# Patient Record
Sex: Male | Born: 1950 | Race: White | Hispanic: No | Marital: Single | Smoking: Current some day smoker
Health system: Southern US, Community
[De-identification: ages and names within clinical notes are randomized; demographics above are authoritative.]

## PROBLEM LIST (undated history)

## (undated) DIAGNOSIS — E785 Hyperlipidemia, unspecified: Secondary | ICD-10-CM

## (undated) DIAGNOSIS — K219 Gastro-esophageal reflux disease without esophagitis: Secondary | ICD-10-CM

## (undated) DIAGNOSIS — M1990 Unspecified osteoarthritis, unspecified site: Secondary | ICD-10-CM

## (undated) DIAGNOSIS — I10 Essential (primary) hypertension: Secondary | ICD-10-CM

## (undated) DIAGNOSIS — C801 Malignant (primary) neoplasm, unspecified: Secondary | ICD-10-CM

## (undated) HISTORY — PX: PROSTATECTOMY: SHX69

## (undated) HISTORY — PX: ORCHIECTOMY: SHX2116

## (undated) HISTORY — PX: BACK SURGERY: SHX140

---

## 2007-04-18 ENCOUNTER — Ambulatory Visit: Payer: Self-pay | Admitting: Internal Medicine

## 2007-04-18 ENCOUNTER — Ambulatory Visit: Payer: Self-pay

## 2007-05-19 ENCOUNTER — Ambulatory Visit: Payer: Self-pay | Admitting: Internal Medicine

## 2007-06-25 ENCOUNTER — Other Ambulatory Visit: Payer: Self-pay

## 2007-06-25 ENCOUNTER — Ambulatory Visit: Payer: Self-pay | Admitting: Urology

## 2007-07-15 ENCOUNTER — Ambulatory Visit: Payer: Self-pay | Admitting: Urology

## 2010-03-22 ENCOUNTER — Ambulatory Visit (HOSPITAL_COMMUNITY): Admission: RE | Admit: 2010-03-22 | Discharge: 2010-03-22 | Payer: Self-pay | Admitting: Gastroenterology

## 2010-04-18 ENCOUNTER — Encounter (HOSPITAL_COMMUNITY): Admission: RE | Admit: 2010-04-18 | Discharge: 2010-05-18 | Payer: Self-pay | Admitting: Neurology

## 2010-10-30 ENCOUNTER — Other Ambulatory Visit (HOSPITAL_COMMUNITY): Payer: Self-pay

## 2010-11-16 ENCOUNTER — Ambulatory Visit (HOSPITAL_COMMUNITY)
Admission: RE | Admit: 2010-11-16 | Discharge: 2010-11-16 | Disposition: A | Discharge status: Home / Self Care | Payer: Self-pay | Source: Ambulatory Visit | Attending: Neurology | Admitting: Neurology

## 2010-11-16 DIAGNOSIS — R079 Chest pain, unspecified: Secondary | ICD-10-CM | POA: Insufficient documentation

## 2010-11-16 DIAGNOSIS — R002 Palpitations: Secondary | ICD-10-CM | POA: Insufficient documentation

## 2010-11-16 DIAGNOSIS — I270 Primary pulmonary hypertension: Secondary | ICD-10-CM

## 2010-11-16 DIAGNOSIS — I10 Essential (primary) hypertension: Secondary | ICD-10-CM | POA: Insufficient documentation

## 2010-11-16 DIAGNOSIS — I517 Cardiomegaly: Secondary | ICD-10-CM

## 2010-11-17 ENCOUNTER — Encounter (HOSPITAL_COMMUNITY): Payer: Self-pay

## 2010-11-17 ENCOUNTER — Other Ambulatory Visit (HOSPITAL_COMMUNITY): Payer: Self-pay

## 2011-12-20 ENCOUNTER — Telehealth: Payer: Self-pay

## 2011-12-20 NOTE — Telephone Encounter (Signed)
Per Crystal, Lubertha Basque said that pt has qualified for 100% [facility] assistance for colonoscopy. I called pt at [phone] and was told he does not live at that number  ( it was listed as home phone). So I removed it.   Than I called [phone] and LMOM for a return call.  ( I do not have a referral on him).

## 2011-12-25 NOTE — Telephone Encounter (Signed)
Called and [facility] for pt to return call [phone]). I will mail a letter to pt also to call.

## 2011-12-31 ENCOUNTER — Other Ambulatory Visit: Payer: Self-pay

## 2012-01-01 ENCOUNTER — Telehealth: Payer: Self-pay

## 2012-01-01 NOTE — Telephone Encounter (Signed)
T/C from the [facility] clinic. She received the fax that this pt has been scheduled for a colonoscopy. She said that pt is self referral. He has been wanting a colonoscopy but he was not referred by them. ( He had told me that he was referred by the [facility]) But I told her he  has some benefits from [facility] and Mrs. Ratliff said he has 100% coverage for a colonoscopy.

## 2012-01-01 NOTE — Telephone Encounter (Signed)
golytely prep UNLESS WE HAVE A MOVIPREP SAMPLE. IF MOVIPREP AVAILABLE, MOVI PREP SPLIT DOSING, REGULAR BREAKFAST. CLEAR LIQUIDS AFTER 9 AM.

## 2012-01-01 NOTE — Telephone Encounter (Signed)
Instructions mailed to pt along with a free voucher to take to the pharmacy for Movie Prep.

## 2012-01-01 NOTE — Telephone Encounter (Signed)
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  PROCEDURE: Procedure(s) requested: colonoscopy Procedure Reason: screening for colon cancer  PATIENT REVIEW QUESTIONS: The patient reports the following:   1. Diabetes Melitis: no 2. Joint replacements in the past 12 months: no 3. Major health problems in the past 3 months: no 4. Has an artificial valve or MVP:no 5. Has been advised in past to take antibiotics in advance of a procedure like teeth cleaning: no}    MEDICATIONS & ALLERGIES:    Patient reports the following regarding taking any blood thinners:   Plavix? no Aspirin?yes  Coumadin?  no  Patient confirms/reports the following medications:  Current Outpatient Prescriptions  Medication Sig Dispense Refill  . aspirin 81 MG tablet Take 81 mg by mouth daily.      Marland Kitchen lisinopril (PRINIVIL,ZESTRIL) 20 MG tablet Take 20 mg by mouth daily.      . pravastatin (PRAVACHOL) 20 MG tablet Take 20 mg by mouth daily.      . ranitidine (ZANTAC) 150 MG tablet Take 150 mg by mouth 2 (two) times daily.        Patient confirms/reports the following allergies:  No Known Allergies  Patient is appropriate to schedule for requested procedure(s): yes  AUTHORIZATION INFORMATION Primary Insurance:  ID #:   Group #:  Pre-Cert / Auth required: Pre-Cert / Auth #:   Secondary Insurance:   ID #:   Group #:  Pre-Cert / Auth required:  Pre-Cert / Auth #:   No orders of the defined types were placed in this encounter.    SCHEDULE INFORMATION: Procedure has been scheduled as follows:  Date: 01/21/2012           Time: 11:15 AM  Location: [facility] Short Stay   This Gastroenterology Pre-Precedure Form is being routed to the following provider(s) for review:  Jonette Eva, MD

## 2012-01-01 NOTE — Telephone Encounter (Signed)
Called [place] again. Pt does have 100% discount on services, other than elective.

## 2012-01-18 ENCOUNTER — Encounter (HOSPITAL_COMMUNITY): Payer: Self-pay | Admitting: Pharmacy Technician

## 2012-01-21 ENCOUNTER — Encounter (HOSPITAL_COMMUNITY): Payer: Self-pay | Admitting: *Deleted

## 2012-01-21 ENCOUNTER — Ambulatory Visit (HOSPITAL_COMMUNITY)
Admission: RE | Admit: 2012-01-21 | Discharge: 2012-01-21 | Disposition: A | Discharge status: Home / Self Care | Payer: Self-pay | Source: Ambulatory Visit | Attending: Gastroenterology | Admitting: Gastroenterology

## 2012-01-21 ENCOUNTER — Encounter (HOSPITAL_COMMUNITY)
Admission: RE | Disposition: A | Discharge status: Home / Self Care | Payer: Self-pay | Source: Ambulatory Visit | Attending: Gastroenterology

## 2012-01-21 DIAGNOSIS — K5730 Diverticulosis of large intestine without perforation or abscess without bleeding: Secondary | ICD-10-CM

## 2012-01-21 DIAGNOSIS — Z1211 Encounter for screening for malignant neoplasm of colon: Secondary | ICD-10-CM

## 2012-01-21 DIAGNOSIS — K648 Other hemorrhoids: Secondary | ICD-10-CM | POA: Insufficient documentation

## 2012-01-21 DIAGNOSIS — Z79899 Other long term (current) drug therapy: Secondary | ICD-10-CM | POA: Insufficient documentation

## 2012-01-21 DIAGNOSIS — Z8546 Personal history of malignant neoplasm of prostate: Secondary | ICD-10-CM | POA: Insufficient documentation

## 2012-01-21 DIAGNOSIS — I10 Essential (primary) hypertension: Secondary | ICD-10-CM | POA: Insufficient documentation

## 2012-01-21 DIAGNOSIS — Z7982 Long term (current) use of aspirin: Secondary | ICD-10-CM | POA: Insufficient documentation

## 2012-01-21 HISTORY — DX: Malignant (primary) neoplasm, unspecified: C80.1

## 2012-01-21 HISTORY — PX: COLONOSCOPY: SHX5424

## 2012-01-21 HISTORY — DX: Unspecified osteoarthritis, unspecified site: M19.90

## 2012-01-21 HISTORY — DX: Hyperlipidemia, unspecified: E78.5

## 2012-01-21 HISTORY — DX: Gastro-esophageal reflux disease without esophagitis: K21.9

## 2012-01-21 HISTORY — DX: Essential (primary) hypertension: I10

## 2012-01-21 SURGERY — COLONOSCOPY
Anesthesia: Moderate Sedation

## 2012-01-21 MED ORDER — MEPERIDINE HCL 100 MG/ML IJ SOLN
INTRAMUSCULAR | Status: AC
  Filled 2012-01-21: qty 2

## 2012-01-21 MED ORDER — MIDAZOLAM HCL 5 MG/5ML IJ SOLN
INTRAMUSCULAR | Status: DC | PRN
  Administered 2012-01-21 (×2): 2 mg via INTRAVENOUS
  Administered 2012-01-21: 1 mg via INTRAVENOUS

## 2012-01-21 MED ORDER — STERILE WATER FOR IRRIGATION IR SOLN
Status: DC | PRN
  Administered 2012-01-21: 12:00:00

## 2012-01-21 MED ORDER — MEPERIDINE HCL 100 MG/ML IJ SOLN
INTRAMUSCULAR | Status: DC | PRN
  Administered 2012-01-21: 50 mg via INTRAVENOUS
  Administered 2012-01-21 (×2): 25 mg via INTRAVENOUS

## 2012-01-21 MED ORDER — SODIUM CHLORIDE 0.45 % IV SOLN
INTRAVENOUS | Status: DC
  Administered 2012-01-21: 1000 mL via INTRAVENOUS

## 2012-01-21 MED ORDER — MIDAZOLAM HCL 5 MG/5ML IJ SOLN
INTRAMUSCULAR | Status: AC
  Filled 2012-01-21: qty 10

## 2012-01-21 NOTE — Op Note (Signed)
[facility] [address] [place], [place]  [address]  COLONOSCOPY PROCEDURE REPORT  PATIENT:  Timothy, Nguyen  MR#:  [id number] BIRTHDATE:  1951-08-05, 60 yrs. old  GENDER:  male  ENDOSCOPIST:  Jonette Eva, MD REF. BY:  [facility] ASSISTANT:  PROCEDURE DATE:  01/21/2012 PROCEDURE:  Colonoscopy 62[address]  INDICATIONS:  SCREENING  MEDICATIONS:   Demerol 100 mg IV, Versed 5 mg IV  DESCRIPTION OF PROCEDURE:    Physical exam was performed. Informed consent was obtained from the patient after explaining the benefits, risks, and alternatives to procedure.  The patient was connected to monitor and placed in left lateral position. Continuous oxygen was provided by nasal cannula and IV medicine administered through an indwelling cannula.  After administration of sedation and rectal exam, the patient's rectum was intubated and the EC-[id number] ([id number]) colonoscope was advanced under direct visualization to the cecum.  The scope was removed slowly by carefully examining the color, texture, anatomy, and integrity mucosa on the way out.  The patient was recovered in endoscopy and discharged home in satisfactory condition. <<PROCEDUREIMAGES>>  FINDINGS:  FREQUENT Diverticula were found in the sigmoid colon. MODERATE Internal Hemorrhoids were found.  PREP QUALITY: EXCELLENT CECAL W/D TIME:    10 minutes  COMPLICATIONS:    None  ENDOSCOPIC IMPRESSION: 1) MODERATE DiverticulOSIS in the sigmoid colon 2) Internal hemorrhoids  RECOMMENDATIONS: HIGH FIBER DIET TCS IN 10 YEARS ALEVE BID FOR 7 DAYS FOR FOOT PAIN  REPEAT EXAM:  No  ______________________________ Jonette Eva, MD  CC:  n. eSIGNED:   [name] at 01/21/2012 12:27 PM  [place], [phone]

## 2012-01-21 NOTE — Discharge Instructions (Signed)
You have small internal hemorrhoids and diverticulosis.   FOLLOW A HIGH FIBER DIET. AVOID ITEMS THAT CAUSE BLOATING. SEE INFO BELOW.  USE ALEVE 2 TWICE DAILY FOR 7 DAYS YOUR LEFT FOOT PAIN. TAKE ALEVE WITH FOOD OR MILK OR IT MAY CAUSE GASTRITIS OR ULCERS.  Next colonoscopy in 10 years. Colonoscopy Care After Read the instructions outlined below and refer to this sheet in the next week. These discharge instructions provide you with general information on caring for yourself after you leave the hospital. While your treatment has been planned according to the most current medical practices available, unavoidable complications occasionally occur. If you have any problems or questions after discharge, call DR. Eldora Napp, [phone].  ACTIVITY  You may resume your regular activity, but move at a slower pace for the next 24 hours.   Take frequent rest periods for the next 24 hours.   Walking will help get rid of the air and reduce the bloated feeling in your belly (abdomen).   No driving for 24 hours (because of the medicine (anesthesia) used during the test).   You may shower.   Do not sign any important legal documents or operate any machinery for 24 hours (because of the anesthesia used during the test).    NUTRITION  Drink plenty of fluids.   You may resume your normal diet as instructed by your doctor.   Begin with a light meal and progress to your normal diet. Heavy or fried foods are harder to digest and may make you feel sick to your stomach (nauseated).   Avoid alcoholic beverages for 24 hours or as instructed.    MEDICATIONS  You may resume your normal medications.   WHAT YOU CAN EXPECT TODAY  Some feelings of bloating in the abdomen.   Passage of more gas than usual.   Spotting of blood in your stool or on the toilet paper  .  IF YOU HAD POLYPS REMOVED DURING THE COLONOSCOPY:  Eat a soft diet IF YOU HAVE NAUSEA, BLOATING, ABDOMINAL PAIN, OR VOMITING.      FINDING OUT THE RESULTS OF YOUR TEST Not all test results are available during your visit. DR. Darrick Penna WILL CALL YOU WITHIN 7 DAYS OF YOUR PROCEDUE WITH YOUR RESULTS. Do not assume everything is normal if you have not heard from DR. Williette Loewe IN ONE WEEK, CALL HER OFFICE AT [phone].  SEEK IMMEDIATE MEDICAL ATTENTION AND CALL THE OFFICE: [phone] IF:  You have more than a spotting of blood in your stool.   Your belly is swollen (abdominal distention).   You are nauseated or vomiting.   You have a temperature over 101F.   You have abdominal pain or discomfort that is severe or gets worse throughout the day.  High-Fiber Diet A high-fiber diet changes your normal diet to include more whole grains, legumes, fruits, and vegetables. Changes in the diet involve replacing refined carbohydrates with unrefined foods. The calorie level of the diet is essentially unchanged. The Dietary Reference Intake (recommended amount) for adult males is 38 grams per day. For adult females, it is 25 grams per day. Pregnant and lactating women should consume 28 grams of fiber per day. Fiber is the intact part of a plant that is not broken down during digestion. Functional fiber is fiber that has been isolated from the plant to provide a beneficial effect in the body. PURPOSE  Increase stool bulk.   Ease and regulate bowel movements.   Lower cholesterol.  INDICATIONS THAT YOU NEED MORE  FIBER  Constipation and hemorrhoids.   Uncomplicated diverticulosis (intestine condition) and irritable bowel syndrome.   Weight management.   As a protective measure against hardening of the arteries (atherosclerosis), diabetes, and cancer.   GUIDELINES FOR INCREASING FIBER IN THE DIET  Start adding fiber to the diet slowly. A gradual increase of about 5 more grams (2 slices of whole-wheat bread, 2 servings of most fruits or vegetables, or 1 bowl of high-fiber cereal) per day is best. Too rapid an increase in fiber  may result in constipation, flatulence, and bloating.   Drink enough water and fluids to keep your urine clear or pale yellow. Water, juice, or caffeine-free drinks are recommended. Not drinking enough fluid may cause constipation.   Eat a variety of high-fiber foods rather than one type of fiber.   Try to increase your intake of fiber through using high-fiber foods rather than fiber pills or supplements that contain small amounts of fiber.   The goal is to change the types of food eaten. Do not supplement your present diet with high-fiber foods, but replace foods in your present diet.  INCLUDE A VARIETY OF FIBER SOURCES  Replace refined and processed grains with whole grains, canned fruits with fresh fruits, and incorporate other fiber sources. White rice, white breads, and most bakery goods contain little or no fiber.   Brown whole-grain rice, buckwheat oats, and many fruits and vegetables are all good sources of fiber. These include: broccoli, Brussels sprouts, cabbage, cauliflower, beets, sweet potatoes, white potatoes (skin on), carrots, tomatoes, eggplant, squash, berries, fresh fruits, and dried fruits.   Cereals appear to be the richest source of fiber. Cereal fiber is found in whole grains and bran. Bran is the fiber-rich outer coat of cereal grain, which is largely removed in refining. In whole-grain cereals, the bran remains. In breakfast cereals, the largest amount of fiber is found in those with "bran" in their names. The fiber content is sometimes indicated on the label.   You may need to include additional fruits and vegetables each day.   In baking, for 1 cup white flour, you may use the following substitutions:   1 cup whole-wheat flour minus 2 tablespoons.   1/2 cup white flour plus 1/2 cup whole-wheat flour.   Diverticulosis Diverticulosis is a common condition that develops when small pouches (diverticula) form in the wall of the colon. The risk of diverticulosis  increases with age. It happens more often in people who eat a low-fiber diet. Most individuals with diverticulosis have no symptoms. Those individuals with symptoms usually experience belly (abdominal) pain, constipation, or loose stools (diarrhea).  HOME CARE INSTRUCTIONS  Increase the amount of fiber in your diet as directed by your caregiver or dietician. This may reduce symptoms of diverticulosis.   Drink at least 6 to 8 glasses of water each day to prevent constipation.   Try not to strain when you have a bowel movement.   Avoiding nuts and seeds to prevent complications is still an uncertain benefit.       FOODS HAVING HIGH FIBER CONTENT INCLUDE:  Fruits. Apple, peach, pear, tangerine, raisins, prunes.   Vegetables. Brussels sprouts, asparagus, broccoli, cabbage, carrot, cauliflower, romaine lettuce, spinach, summer squash, tomato, winter squash, zucchini.   Starchy Vegetables. Baked beans, kidney beans, lima beans, split peas, lentils, potatoes (with skin).   Grains. Whole wheat bread, brown rice, bran flake cereal, plain oatmeal, white rice, shredded wheat, bran muffins.    SEEK IMMEDIATE MEDICAL CARE IF:  You  develop increasing pain or severe bloating.   You have an oral temperature above 101F.   You develop vomiting or bowel movements that are bloody or black.   Hemorrhoids Hemorrhoids are dilated (enlarged) veins around the rectum. Sometimes clots will form in the veins. This makes them swollen and painful. These are called thrombosed hemorrhoids. Causes of hemorrhoids include:  Constipation.   Straining to have a bowel movement.   HEAVY LIFTING HOME CARE INSTRUCTIONS  Eat a well balanced diet and drink 6 to 8 glasses of water every day to avoid constipation. You may also use a bulk laxative.   Avoid straining to have bowel movements.   Keep anal area dry and clean.   Do not use a donut shaped pillow or sit on the toilet for long periods. This increases  blood pooling and pain.   Move your bowels when your body has the urge; this will require less straining and will decrease pain and pressure.

## 2012-01-21 NOTE — H&P (Signed)
  Primary Care Physician:   Primary Gastroenterologist:  Dr. Darrick Penna  Pre-Procedure History & Physical: HPI:  Timothy Nguyen is a 61 y.o. male here for COLON CANCER SCREENING.   Past Medical History  Diagnosis Date  . GERD (gastroesophageal reflux disease)   . Hypertension   . Hyperlipemia   . Cancer     prostate, testicular  . Arthritis     Past Surgical History  Procedure Date  . Back surgery   . Orchiectomy   . Prostatectomy     Prior to Admission medications   Medication Sig Start Date End Date Taking? Authorizing Provider  aspirin EC 81 MG tablet Take 81 mg by mouth daily.   Yes Historical Provider, MD  lisinopril (PRINIVIL,ZESTRIL) 20 MG tablet Take 20 mg by mouth daily.   Yes Historical Provider, MD  pravastatin (PRAVACHOL) 20 MG tablet Take 20 mg by mouth daily.   Yes Historical Provider, MD  ranitidine (ZANTAC) 150 MG tablet Take 150 mg by mouth 2 (two) times daily.   Yes Historical Provider, MD    Allergies as of 12/31/2011  . (No Known Allergies)    History reviewed. No pertinent family history.  History   Social History  . Marital Status: Single    Spouse Name: N/A    Number of Children: N/A  . Years of Education: N/A   Occupational History  . Not on file.   Social History Main Topics  . Smoking status: Never Smoker   . Smokeless tobacco: Not on file  . Alcohol Use: Yes  . Drug Use: No  . Sexually Active:    Other Topics Concern  . Not on file   Social History Narrative  . No narrative on file    Review of Systems: See HPI, otherwise negative ROS   Physical Exam: BP 141/96  Pulse 82  Temp(Src) 97.8 F (36.6 C) (Oral)  Resp 18  Ht 5\' 7"  (1.702 m)  Wt 160 lb (72.576 kg)  BMI 25.06 kg/m2  SpO2 99% General:   Alert,  pleasant and cooperative in NAD Head:  Normocephalic and atraumatic. Neck:  Supple;  Lungs:  Clear throughout to auscultation.    Heart:  Regular rate and rhythm. Abdomen:  Soft, nontender and nondistended. Normal  bowel sounds, without guarding, and without rebound.   Neurologic:  Alert and  oriented x4;  grossly normal neurologically.  Impression/Plan:    AVERAGE RISK  PLAN: TCS TODAY

## 2012-01-23 ENCOUNTER — Encounter (HOSPITAL_COMMUNITY): Payer: Self-pay | Admitting: Gastroenterology

## 2012-01-28 ENCOUNTER — Other Ambulatory Visit: Payer: Self-pay | Admitting: Obstetrics and Gynecology

## 2012-03-03 ENCOUNTER — Other Ambulatory Visit: Payer: Self-pay | Admitting: Obstetrics and Gynecology

## 2015-08-02 ENCOUNTER — Other Ambulatory Visit: Payer: Self-pay | Admitting: Physician Assistant

## 2015-08-27 ENCOUNTER — Other Ambulatory Visit: Payer: Self-pay | Admitting: Physician Assistant

## 2015-10-14 DIAGNOSIS — F411 Generalized anxiety disorder: Secondary | ICD-10-CM | POA: Diagnosis not present

## 2015-10-14 DIAGNOSIS — F1010 Alcohol abuse, uncomplicated: Secondary | ICD-10-CM | POA: Diagnosis not present

## 2015-10-14 DIAGNOSIS — Z8546 Personal history of malignant neoplasm of prostate: Secondary | ICD-10-CM | POA: Diagnosis not present

## 2015-10-14 DIAGNOSIS — R000 Tachycardia, unspecified: Secondary | ICD-10-CM | POA: Diagnosis not present

## 2015-10-14 DIAGNOSIS — F17210 Nicotine dependence, cigarettes, uncomplicated: Secondary | ICD-10-CM | POA: Diagnosis not present

## 2015-10-14 DIAGNOSIS — K219 Gastro-esophageal reflux disease without esophagitis: Secondary | ICD-10-CM | POA: Diagnosis not present

## 2015-10-14 DIAGNOSIS — Z205 Contact with and (suspected) exposure to viral hepatitis: Secondary | ICD-10-CM | POA: Diagnosis not present

## 2015-10-14 DIAGNOSIS — E782 Mixed hyperlipidemia: Secondary | ICD-10-CM | POA: Diagnosis not present

## 2015-10-14 DIAGNOSIS — Z8547 Personal history of malignant neoplasm of testis: Secondary | ICD-10-CM | POA: Diagnosis not present

## 2015-10-14 DIAGNOSIS — I10 Essential (primary) hypertension: Secondary | ICD-10-CM | POA: Diagnosis not present

## 2015-10-17 DIAGNOSIS — I358 Other nonrheumatic aortic valve disorders: Secondary | ICD-10-CM | POA: Diagnosis not present

## 2015-10-17 DIAGNOSIS — R931 Abnormal findings on diagnostic imaging of heart and coronary circulation: Secondary | ICD-10-CM | POA: Diagnosis not present

## 2015-10-17 DIAGNOSIS — R000 Tachycardia, unspecified: Secondary | ICD-10-CM | POA: Diagnosis not present

## 2015-10-17 DIAGNOSIS — E785 Hyperlipidemia, unspecified: Secondary | ICD-10-CM | POA: Diagnosis not present

## 2015-10-17 DIAGNOSIS — I10 Essential (primary) hypertension: Secondary | ICD-10-CM | POA: Diagnosis not present

## 2015-10-17 DIAGNOSIS — R9439 Abnormal result of other cardiovascular function study: Secondary | ICD-10-CM | POA: Diagnosis not present

## 2015-10-17 DIAGNOSIS — I517 Cardiomegaly: Secondary | ICD-10-CM | POA: Diagnosis not present

## 2015-10-17 DIAGNOSIS — I351 Nonrheumatic aortic (valve) insufficiency: Secondary | ICD-10-CM | POA: Diagnosis not present

## 2015-10-17 DIAGNOSIS — I340 Nonrheumatic mitral (valve) insufficiency: Secondary | ICD-10-CM | POA: Diagnosis not present

## 2015-10-25 DIAGNOSIS — R9439 Abnormal result of other cardiovascular function study: Secondary | ICD-10-CM | POA: Diagnosis not present

## 2015-12-26 DIAGNOSIS — M7522 Bicipital tendinitis, left shoulder: Secondary | ICD-10-CM | POA: Diagnosis not present

## 2015-12-26 DIAGNOSIS — M62522 Muscle wasting and atrophy, not elsewhere classified, left upper arm: Secondary | ICD-10-CM | POA: Diagnosis not present

## 2015-12-29 DIAGNOSIS — M25512 Pain in left shoulder: Secondary | ICD-10-CM | POA: Diagnosis not present

## 2015-12-29 DIAGNOSIS — M4802 Spinal stenosis, cervical region: Secondary | ICD-10-CM | POA: Diagnosis not present

## 2015-12-29 DIAGNOSIS — M47812 Spondylosis without myelopathy or radiculopathy, cervical region: Secondary | ICD-10-CM | POA: Diagnosis not present

## 2016-02-02 DIAGNOSIS — K219 Gastro-esophageal reflux disease without esophagitis: Secondary | ICD-10-CM | POA: Diagnosis not present

## 2016-02-02 DIAGNOSIS — I10 Essential (primary) hypertension: Secondary | ICD-10-CM | POA: Diagnosis not present

## 2016-02-02 DIAGNOSIS — F17210 Nicotine dependence, cigarettes, uncomplicated: Secondary | ICD-10-CM | POA: Diagnosis not present

## 2016-02-02 DIAGNOSIS — F1010 Alcohol abuse, uncomplicated: Secondary | ICD-10-CM | POA: Diagnosis not present

## 2016-02-02 DIAGNOSIS — F411 Generalized anxiety disorder: Secondary | ICD-10-CM | POA: Diagnosis not present

## 2016-02-02 DIAGNOSIS — E782 Mixed hyperlipidemia: Secondary | ICD-10-CM | POA: Diagnosis not present

## 2016-02-06 DIAGNOSIS — E782 Mixed hyperlipidemia: Secondary | ICD-10-CM | POA: Diagnosis not present

## 2016-02-06 DIAGNOSIS — F17210 Nicotine dependence, cigarettes, uncomplicated: Secondary | ICD-10-CM | POA: Diagnosis not present

## 2016-02-06 DIAGNOSIS — M7522 Bicipital tendinitis, left shoulder: Secondary | ICD-10-CM | POA: Diagnosis not present

## 2016-02-06 DIAGNOSIS — K219 Gastro-esophageal reflux disease without esophagitis: Secondary | ICD-10-CM | POA: Diagnosis not present

## 2016-02-06 DIAGNOSIS — M4802 Spinal stenosis, cervical region: Secondary | ICD-10-CM | POA: Diagnosis not present

## 2016-02-06 DIAGNOSIS — I10 Essential (primary) hypertension: Secondary | ICD-10-CM | POA: Diagnosis not present

## 2016-02-06 DIAGNOSIS — F411 Generalized anxiety disorder: Secondary | ICD-10-CM | POA: Diagnosis not present

## 2016-02-06 DIAGNOSIS — M62522 Muscle wasting and atrophy, not elsewhere classified, left upper arm: Secondary | ICD-10-CM | POA: Diagnosis not present

## 2016-07-30 DIAGNOSIS — F411 Generalized anxiety disorder: Secondary | ICD-10-CM | POA: Diagnosis not present

## 2016-07-30 DIAGNOSIS — F1010 Alcohol abuse, uncomplicated: Secondary | ICD-10-CM | POA: Diagnosis not present

## 2016-07-30 DIAGNOSIS — R000 Tachycardia, unspecified: Secondary | ICD-10-CM | POA: Diagnosis not present

## 2016-07-30 DIAGNOSIS — K219 Gastro-esophageal reflux disease without esophagitis: Secondary | ICD-10-CM | POA: Diagnosis not present

## 2016-07-30 DIAGNOSIS — F17210 Nicotine dependence, cigarettes, uncomplicated: Secondary | ICD-10-CM | POA: Diagnosis not present

## 2016-07-30 DIAGNOSIS — N5234 Erectile dysfunction following simple prostatectomy: Secondary | ICD-10-CM | POA: Diagnosis not present

## 2016-07-30 DIAGNOSIS — E782 Mixed hyperlipidemia: Secondary | ICD-10-CM | POA: Diagnosis not present

## 2016-07-30 DIAGNOSIS — I10 Essential (primary) hypertension: Secondary | ICD-10-CM | POA: Diagnosis not present

## 2016-08-07 DIAGNOSIS — F1010 Alcohol abuse, uncomplicated: Secondary | ICD-10-CM | POA: Diagnosis not present

## 2016-08-07 DIAGNOSIS — Z0001 Encounter for general adult medical examination with abnormal findings: Secondary | ICD-10-CM | POA: Diagnosis not present

## 2016-08-07 DIAGNOSIS — Z23 Encounter for immunization: Secondary | ICD-10-CM | POA: Diagnosis not present

## 2016-08-07 DIAGNOSIS — F411 Generalized anxiety disorder: Secondary | ICD-10-CM | POA: Diagnosis not present

## 2016-08-07 DIAGNOSIS — Z6827 Body mass index (BMI) 27.0-27.9, adult: Secondary | ICD-10-CM | POA: Diagnosis not present

## 2016-08-07 DIAGNOSIS — I10 Essential (primary) hypertension: Secondary | ICD-10-CM | POA: Diagnosis not present

## 2016-08-07 DIAGNOSIS — E782 Mixed hyperlipidemia: Secondary | ICD-10-CM | POA: Diagnosis not present

## 2016-08-07 DIAGNOSIS — F17210 Nicotine dependence, cigarettes, uncomplicated: Secondary | ICD-10-CM | POA: Diagnosis not present

## 2016-11-27 DIAGNOSIS — K219 Gastro-esophageal reflux disease without esophagitis: Secondary | ICD-10-CM | POA: Diagnosis not present

## 2016-11-27 DIAGNOSIS — Z8546 Personal history of malignant neoplasm of prostate: Secondary | ICD-10-CM | POA: Diagnosis not present

## 2016-11-27 DIAGNOSIS — M4802 Spinal stenosis, cervical region: Secondary | ICD-10-CM | POA: Diagnosis not present

## 2016-11-27 DIAGNOSIS — F411 Generalized anxiety disorder: Secondary | ICD-10-CM | POA: Diagnosis not present

## 2016-11-27 DIAGNOSIS — F1010 Alcohol abuse, uncomplicated: Secondary | ICD-10-CM | POA: Diagnosis not present

## 2016-11-27 DIAGNOSIS — I10 Essential (primary) hypertension: Secondary | ICD-10-CM | POA: Diagnosis not present

## 2016-11-27 DIAGNOSIS — E782 Mixed hyperlipidemia: Secondary | ICD-10-CM | POA: Diagnosis not present

## 2016-11-27 DIAGNOSIS — Z8547 Personal history of malignant neoplasm of testis: Secondary | ICD-10-CM | POA: Diagnosis not present

## 2016-12-03 DIAGNOSIS — J301 Allergic rhinitis due to pollen: Secondary | ICD-10-CM | POA: Diagnosis not present

## 2016-12-03 DIAGNOSIS — H6983 Other specified disorders of Eustachian tube, bilateral: Secondary | ICD-10-CM | POA: Diagnosis not present

## 2016-12-03 DIAGNOSIS — Z6827 Body mass index (BMI) 27.0-27.9, adult: Secondary | ICD-10-CM | POA: Diagnosis not present

## 2017-02-26 DIAGNOSIS — F17210 Nicotine dependence, cigarettes, uncomplicated: Secondary | ICD-10-CM | POA: Diagnosis not present

## 2017-02-26 DIAGNOSIS — N400 Enlarged prostate without lower urinary tract symptoms: Secondary | ICD-10-CM | POA: Diagnosis not present

## 2017-02-26 DIAGNOSIS — K219 Gastro-esophageal reflux disease without esophagitis: Secondary | ICD-10-CM | POA: Diagnosis not present

## 2017-02-26 DIAGNOSIS — E782 Mixed hyperlipidemia: Secondary | ICD-10-CM | POA: Diagnosis not present

## 2017-02-26 DIAGNOSIS — I10 Essential (primary) hypertension: Secondary | ICD-10-CM | POA: Diagnosis not present

## 2017-03-01 DIAGNOSIS — F17210 Nicotine dependence, cigarettes, uncomplicated: Secondary | ICD-10-CM | POA: Diagnosis not present

## 2017-03-01 DIAGNOSIS — I10 Essential (primary) hypertension: Secondary | ICD-10-CM | POA: Diagnosis not present

## 2017-03-01 DIAGNOSIS — F411 Generalized anxiety disorder: Secondary | ICD-10-CM | POA: Diagnosis not present

## 2017-03-01 DIAGNOSIS — F1010 Alcohol abuse, uncomplicated: Secondary | ICD-10-CM | POA: Diagnosis not present

## 2017-03-01 DIAGNOSIS — E782 Mixed hyperlipidemia: Secondary | ICD-10-CM | POA: Diagnosis not present

## 2017-03-01 DIAGNOSIS — K219 Gastro-esophageal reflux disease without esophagitis: Secondary | ICD-10-CM | POA: Diagnosis not present

## 2017-03-01 DIAGNOSIS — Z6826 Body mass index (BMI) 26.0-26.9, adult: Secondary | ICD-10-CM | POA: Diagnosis not present

## 2017-03-01 DIAGNOSIS — N400 Enlarged prostate without lower urinary tract symptoms: Secondary | ICD-10-CM | POA: Diagnosis not present

## 2017-03-21 ENCOUNTER — Encounter: Payer: Self-pay | Admitting: Cardiovascular Disease

## 2017-03-21 DIAGNOSIS — R9439 Abnormal result of other cardiovascular function study: Secondary | ICD-10-CM | POA: Diagnosis not present

## 2017-04-18 ENCOUNTER — Encounter: Payer: Self-pay | Admitting: Cardiovascular Disease

## 2017-04-18 ENCOUNTER — Encounter: Payer: Self-pay | Admitting: *Deleted

## 2017-04-18 ENCOUNTER — Ambulatory Visit (INDEPENDENT_AMBULATORY_CARE_PROVIDER_SITE_OTHER): Payer: Medicare HMO | Admitting: Cardiovascular Disease

## 2017-04-18 VITALS — BP 138/90 | HR 87 | Ht 65.0 in | Wt 165.0 lb

## 2017-04-18 DIAGNOSIS — R9439 Abnormal result of other cardiovascular function study: Secondary | ICD-10-CM | POA: Diagnosis not present

## 2017-04-18 DIAGNOSIS — I10 Essential (primary) hypertension: Secondary | ICD-10-CM | POA: Diagnosis not present

## 2017-04-18 NOTE — Patient Instructions (Signed)
Medication Instructions:  Continue all current medications.  Labwork: none  Testing/Procedures: none  Follow-Up: To be determined.    Any Other Special Instructions Will Be Listed Below (If Applicable).  If you need a refill on your cardiac medications before your next appointment, please call your pharmacy.  

## 2017-04-18 NOTE — Progress Notes (Addendum)
CARDIOLOGY CONSULT NOTE  Patient ID: Timothy Nguyen MRN: [id number] DOB/AGE: 01-16-1951 66 y.o.  Admit date: (Not on file) Primary Physician: Timothy Labrum, MD Referring Physician: Pleas Nguyen  Reason for Consultation: Abnormal stress test  HPI: Timothy Nguyen is a 66 y.o. male who is being seen today for the evaluation of abnormal stress test at the request of Timothy Nguyen, Timothy Evener, MD.  He has hypertension and hyperlipidemia.  He underwent a nuclear stress test on 03/21/17 at [facility]. I am trying to obtain the official report.  It appears he previously (2017) underwent a nuclear stress test which was abnormal and deemed to be false positive. At that time he had very good functional status. He apparently had an echocardiogram done at that time as well but I do not have these results.  He is very active and exercises at the [facility] 5-7 days per week. He lifts weights, runs, walks, and boxes. He denies exertional chest pain and shortness of breath.  He was scheduled to fight Timothy Nguyen in [place] back in the 1970s but his wife prevented him from doing so. He boxed in the light middleweight division.  He says he drinks too much hard liquor every now and then and knows he should quit.  ECG performed in the office today which I ordered in person interpreted demonstrated sinus rhythm with nonspecific ST segment and T-wave abnormalities.   No Known Allergies  Current Outpatient Prescriptions  Medication Sig Dispense Refill  . aspirin EC 81 MG tablet Take 81 mg by mouth daily.    . fluticasone (FLONASE ALLERGY RELIEF) 50 MCG/ACT nasal spray Place into both nostrils daily.    Marland Kitchen lisinopril (PRINIVIL,ZESTRIL) 20 MG tablet Take 20 mg by mouth 2 (two) times daily.     Marland Kitchen loratadine (CLARITIN) 10 MG tablet Take 10 mg by mouth daily.    . Omega-3 Fatty Acids (OMEGA 3 500 PO) Take by mouth.    . ranitidine (ZANTAC) 150 MG tablet Take 150 mg by mouth 2 (two) times daily.    .  tadalafil (CIALIS) 10 MG tablet Take 10 mg by mouth daily as needed for erectile dysfunction.    . vitamin B-12 (CYANOCOBALAMIN) 1000 MCG tablet Take 1,000 mcg by mouth daily.     No current facility-administered medications for this visit.     Past Medical History:  Diagnosis Date  . Arthritis   . Cancer [facility])    prostate, testicular  . GERD (gastroesophageal reflux disease)   . Hyperlipemia   . Hypertension     Past Surgical History:  Procedure Laterality Date  . BACK SURGERY    . COLONOSCOPY  01/21/2012   Procedure: COLONOSCOPY;  Surgeon: Timothy Binder, MD;  Location: [facility];  Service: Endoscopy;  Laterality: N/A;  11:15 AM  . ORCHIECTOMY    . PROSTATECTOMY      Social History   Social History  . Marital status: Single    Spouse name: N/A  . Number of children: N/A  . Years of education: N/A   Occupational History  . Not on file.   Social History Main Topics  . Smoking status: Current Some Day Smoker    Packs/day: 0.20  . Smokeless tobacco: Never Used     Comment: just smokes when he drinks not often   . Alcohol use Yes  . Drug use: No  . Sexual activity: Not on file   Other Topics Concern  .  Not on file   Social History Narrative  . No narrative on file     No family history of premature CAD in 1st degree relatives.  Current Meds  Medication Sig  . aspirin EC 81 MG tablet Take 81 mg by mouth daily.  . fluticasone (FLONASE ALLERGY RELIEF) 50 MCG/ACT nasal spray Place into both nostrils daily.  Marland Kitchen lisinopril (PRINIVIL,ZESTRIL) 20 MG tablet Take 20 mg by mouth 2 (two) times daily.   Marland Kitchen loratadine (CLARITIN) 10 MG tablet Take 10 mg by mouth daily.  . Omega-3 Fatty Acids (OMEGA 3 500 PO) Take by mouth.  . ranitidine (ZANTAC) 150 MG tablet Take 150 mg by mouth 2 (two) times daily.  . tadalafil (CIALIS) 10 MG tablet Take 10 mg by mouth daily as needed for erectile dysfunction.  . vitamin B-12 (CYANOCOBALAMIN) 1000 MCG tablet Take 1,000 mcg by mouth  daily.      Review of systems complete and found to be negative unless listed above in HPI    Physical exam Blood pressure 138/90, pulse 87, height 5\' 5"  (1.651 m), weight 165 lb (74.8 kg), SpO2 98 %. General: NAD Neck: No JVD, no thyromegaly or thyroid nodule.  Lungs: Clear to auscultation bilaterally with normal respiratory effort. CV: Nondisplaced PMI. Regular rate and rhythm, normal S1/S2, no S3/S4, no murmur.  No peripheral edema.  No carotid bruit.    Abdomen: Soft, nontender, no distention.  Skin: Intact without lesions or rashes.  Neurologic: Alert and oriented x 3.  Psych: Normal affect. Extremities: No clubbing or cyanosis.  HEENT: Normal.   ECG: Most recent ECG reviewed.   Labs: No results found for: K, BUN, CREATININE, ALT, TSH, HGB   Lipids: No results found for: LDLCALC, LDLDIRECT, CHOL, TRIG, HDL      ASSESSMENT AND PLAN:  1. Abnormal nuclear stress test: I have yet to obtain the official nuclear perfusion report. Regardless, he exercises every day without limitation. He has no exertional chest pain or shortness of breath. He takes aspirin 81 mg daily. I recommended he cut back on alcohol use. Unless he had several high risk findings, I would not recommend coronary angiography anyway.  2. Hypertension: Mildly elevated diastolic pressure. Needs continued monitoring.  Disposition: Follow up to be determined  Signed: Kate Nguyen, M.D., F.A.C.C.  04/18/2017, 1:46 PM   ADDENDUM: I reviewed the nuclear stress test results performed on 03/21/17. 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 Reportedly, there was hypokinesis of the inferior wall, LV EF 44%.  He is already on aspirin 81 mg daily. Given his excellent exercise tolerance, I will elect to medically manage. I will start statin therapy with Crestor 5 mg.

## 2017-04-29 ENCOUNTER — Telehealth: Payer: Self-pay | Admitting: *Deleted

## 2017-04-29 NOTE — Telephone Encounter (Signed)
RE: medication change  Received: 5 days ago  Message Contents  Herminio Commons, MD  Laurine Blazer, LPN        That would be fine.   Previous Messages    ----- Message -----  From: Laurine Blazer, LPN  Sent: 09/22/1094  6:08 PM  To: Herminio Commons, MD  Subject: medication change                 Patient informed to begin Crestor 5mg . Stated that he had been on Simvastatin 20mg  daily at one point, but stopped on his own. He prefers to go back on that instead of something new. Stated that he did not have any problems while on that medication.    Thanks,  Edd Fabian

## 2017-04-29 NOTE — Telephone Encounter (Signed)
Patient came by office today with names of cholesterol medication.  Brought bottle in.  Stated he had been on Simvastatin 20mg  in the past, but that made him feel bad & he stopped on his own.  Stated that he has also been on Pravastatin and he tolerated that without any problems.  Please advise if this medication is okay & at what dose.

## 2017-04-30 NOTE — Telephone Encounter (Signed)
No answer

## 2017-04-30 NOTE — Telephone Encounter (Signed)
Crestor is a better medication with a good side effect profile. Would prefer to try 5 mg initially to see if he tolerates.

## 2017-05-07 MED ORDER — ROSUVASTATIN CALCIUM 5 MG PO TABS: 5.0000 mg | ORAL_TABLET | Freq: Every day | ORAL | 6 refills | Status: DC

## 2017-05-07 NOTE — Telephone Encounter (Signed)
Patient notified.  Will send new medication to [facility] now.

## 2017-06-14 DIAGNOSIS — Z6826 Body mass index (BMI) 26.0-26.9, adult: Secondary | ICD-10-CM | POA: Diagnosis not present

## 2017-06-14 DIAGNOSIS — M25551 Pain in right hip: Secondary | ICD-10-CM | POA: Diagnosis not present

## 2017-09-03 DIAGNOSIS — Z23 Encounter for immunization: Secondary | ICD-10-CM | POA: Diagnosis not present

## 2017-10-03 DIAGNOSIS — Z6827 Body mass index (BMI) 27.0-27.9, adult: Secondary | ICD-10-CM | POA: Diagnosis not present

## 2017-10-03 DIAGNOSIS — J0101 Acute recurrent maxillary sinusitis: Secondary | ICD-10-CM | POA: Diagnosis not present

## 2017-10-03 DIAGNOSIS — J069 Acute upper respiratory infection, unspecified: Secondary | ICD-10-CM | POA: Diagnosis not present

## 2017-12-04 DIAGNOSIS — R000 Tachycardia, unspecified: Secondary | ICD-10-CM | POA: Diagnosis not present

## 2017-12-04 DIAGNOSIS — F17210 Nicotine dependence, cigarettes, uncomplicated: Secondary | ICD-10-CM | POA: Diagnosis not present

## 2017-12-04 DIAGNOSIS — F1010 Alcohol abuse, uncomplicated: Secondary | ICD-10-CM | POA: Diagnosis not present

## 2017-12-04 DIAGNOSIS — E782 Mixed hyperlipidemia: Secondary | ICD-10-CM | POA: Diagnosis not present

## 2017-12-04 DIAGNOSIS — I10 Essential (primary) hypertension: Secondary | ICD-10-CM | POA: Diagnosis not present

## 2017-12-04 DIAGNOSIS — K219 Gastro-esophageal reflux disease without esophagitis: Secondary | ICD-10-CM | POA: Diagnosis not present

## 2017-12-04 DIAGNOSIS — Z8546 Personal history of malignant neoplasm of prostate: Secondary | ICD-10-CM | POA: Diagnosis not present

## 2017-12-04 DIAGNOSIS — F411 Generalized anxiety disorder: Secondary | ICD-10-CM | POA: Diagnosis not present

## 2017-12-18 ENCOUNTER — Ambulatory Visit (INDEPENDENT_AMBULATORY_CARE_PROVIDER_SITE_OTHER): Payer: Medicare HMO | Admitting: Cardiovascular Disease

## 2017-12-18 ENCOUNTER — Encounter: Payer: Self-pay | Admitting: Cardiovascular Disease

## 2017-12-18 VITALS — BP 140/98 | HR 100 | Ht 66.0 in | Wt 164.0 lb

## 2017-12-18 DIAGNOSIS — I10 Essential (primary) hypertension: Secondary | ICD-10-CM

## 2017-12-18 DIAGNOSIS — R9439 Abnormal result of other cardiovascular function study: Secondary | ICD-10-CM

## 2017-12-18 NOTE — Progress Notes (Signed)
SUBJECTIVE: The patient presents for routine follow-up.  He has presumed coronary artery disease based on an abnormal stress test.  Nuclear stress test performed on 03/21/17 demonstrated a small fixed defect in the inferior wall consistent with prior infarct with no reversible ischemia.  Reportedly, there was hypokinesis of the inferior wall, LVEF 44%.  I started Crestor 5 mg after reviewing the stress test results.  He was already taking aspirin 81 mg.  The patient denies any symptoms of chest pain, palpitations, shortness of breath, lightheadedness, dizziness, leg swelling, orthopnea, PND, and syncope.  He goes to the gym twice a day and has been training in both boxing and jujitsu.  He drank 1/2 pint of vodka last night.  He walks 3-4 miles daily at work without difficulty.  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  He is previously box in [place], [place], and [place].  He just got back from the gym.   Soc Hx: He was scheduled to fight [name] in [place] back in the 1970s but his wife prevented him from doing so. He boxed in the light middleweight division.   Review of Systems: As per "subjective", otherwise negative.  No Known Allergies  Current Outpatient Medications  Medication Sig Dispense Refill  . aspirin EC 81 MG tablet Take 81 mg by mouth daily.    Marland Kitchen lisinopril (PRINIVIL,ZESTRIL) 20 MG tablet Take 20 mg by mouth 2 (two) times daily.     . Omega-3 Fatty Acids (OMEGA 3 500 PO) Take by mouth.    . ranitidine (ZANTAC) 150 MG tablet Take 150 mg by mouth 2 (two) times daily.    . rosuvastatin (CRESTOR) 5 MG tablet Take 1 tablet (5 mg total) by mouth daily. 30 tablet 6   No current facility-administered medications for this visit.     Past Medical History:  Diagnosis Date  . Arthritis   . Cancer [facility])    prostate, testicular  . GERD (gastroesophageal reflux disease)   . Hyperlipemia   . Hypertension     Past Surgical History:  Procedure  Laterality Date  . BACK SURGERY    . COLONOSCOPY  01/21/2012   Procedure: COLONOSCOPY;  Surgeon: Danie Binder, MD;  Location: [facility];  Service: Endoscopy;  Laterality: N/A;  11:15 AM  . ORCHIECTOMY    . PROSTATECTOMY      Social History   Socioeconomic History  . Marital status: Single    Spouse name: Not on file  . Number of children: Not on file  . Years of education: Not on file  . Highest education level: Not on file  Occupational History  . Not on file  Social Needs  . Financial resource strain: Not on file  . Food insecurity:    Worry: Not on file    Inability: Not on file  . Transportation needs:    Medical: Not on file    Non-medical: Not on file  Tobacco Use  . Smoking status: Current Some Day Smoker    Packs/day: 0.20  . Smokeless tobacco: Never Used  . Tobacco comment: just smokes when he drinks not often   Substance and Sexual Activity  . Alcohol use: Yes  . Drug use: No  . Sexual activity: Not on file  Lifestyle  . Physical activity:    Days per week: Not on file    Minutes per session: Not on file  . Stress: Not on file  Relationships  .  Social connections:    Talks on phone: Not on file    Gets together: Not on file    Attends religious service: Not on file    Active member of club or organization: Not on file    Attends meetings of clubs or organizations: Not on file    Relationship status: Not on file  . Intimate partner violence:    Fear of current or ex partner: Not on file    Emotionally abused: Not on file    Physically abused: Not on file    Forced sexual activity: Not on file  Other Topics Concern  . Not on file  Social History Narrative  . Not on file     Vitals:   12/18/17 1456  BP: (!) 140/98  Pulse: 100  SpO2: 98%  Weight: 164 lb (74.4 kg)  Height: 5\' 6"  (1.676 m)    Wt Readings from Last 3 Encounters:  12/18/17 164 lb (74.4 kg)  04/18/17 165 lb (74.8 kg)  01/21/12 160 lb (72.6 kg)     PHYSICAL  EXAM General: NAD HEENT: Normal. Neck: No JVD, no thyromegaly. Lungs: Clear to auscultation bilaterally with normal respiratory effort. CV: Regular rate and rhythm, normal S1/S2, no S3/S4, no murmur. No pretibial or periankle edema.  No carotid bruit.   Abdomen: Soft, nontender, no distention.  Neurologic: Alert and oriented.  Psych: Normal affect. Skin: Normal. Musculoskeletal: No gross deformities.    ECG: Most recent ECG reviewed.   Labs: No results found for: K, BUN, CREATININE, ALT, TSH, HGB   Lipids: No results found for: LDLCALC, LDLDIRECT, CHOL, TRIG, HDL     ASSESSMENT AND PLAN: 1. Abnormal nuclear stress test/presumed CAD: Symptomatically stable.  Continue aspirin and Crestor.  2. Hypertension: Blood pressure is elevated.  However, he checks it at home and it is routinely in the 110/70 range.  He just came from the gym.  No changes to therapy today.     Disposition: Follow up 1 year   Kate Sable, M.D., F.A.C.C.

## 2017-12-18 NOTE — Patient Instructions (Signed)

## 2017-12-19 DIAGNOSIS — Z6826 Body mass index (BMI) 26.0-26.9, adult: Secondary | ICD-10-CM | POA: Diagnosis not present

## 2017-12-19 DIAGNOSIS — S025XXB Fracture of tooth (traumatic), initial encounter for open fracture: Secondary | ICD-10-CM | POA: Diagnosis not present

## 2018-01-16 DIAGNOSIS — Z8546 Personal history of malignant neoplasm of prostate: Secondary | ICD-10-CM | POA: Diagnosis not present

## 2018-01-16 DIAGNOSIS — I2510 Atherosclerotic heart disease of native coronary artery without angina pectoris: Secondary | ICD-10-CM | POA: Diagnosis not present

## 2018-01-16 DIAGNOSIS — F17210 Nicotine dependence, cigarettes, uncomplicated: Secondary | ICD-10-CM | POA: Diagnosis not present

## 2018-01-16 DIAGNOSIS — I10 Essential (primary) hypertension: Secondary | ICD-10-CM | POA: Diagnosis not present

## 2018-01-16 DIAGNOSIS — E782 Mixed hyperlipidemia: Secondary | ICD-10-CM | POA: Diagnosis not present

## 2018-01-16 DIAGNOSIS — Z23 Encounter for immunization: Secondary | ICD-10-CM | POA: Diagnosis not present

## 2018-01-16 DIAGNOSIS — M160 Bilateral primary osteoarthritis of hip: Secondary | ICD-10-CM | POA: Diagnosis not present

## 2018-01-16 DIAGNOSIS — F411 Generalized anxiety disorder: Secondary | ICD-10-CM | POA: Diagnosis not present

## 2018-01-16 DIAGNOSIS — Z0001 Encounter for general adult medical examination with abnormal findings: Secondary | ICD-10-CM | POA: Diagnosis not present

## 2018-01-28 DIAGNOSIS — I340 Nonrheumatic mitral (valve) insufficiency: Secondary | ICD-10-CM | POA: Diagnosis not present

## 2018-01-28 DIAGNOSIS — R011 Cardiac murmur, unspecified: Secondary | ICD-10-CM | POA: Diagnosis not present

## 2018-01-28 DIAGNOSIS — I517 Cardiomegaly: Secondary | ICD-10-CM | POA: Diagnosis not present

## 2018-03-26 DIAGNOSIS — I10 Essential (primary) hypertension: Secondary | ICD-10-CM | POA: Diagnosis not present

## 2018-03-26 DIAGNOSIS — K219 Gastro-esophageal reflux disease without esophagitis: Secondary | ICD-10-CM | POA: Diagnosis not present

## 2018-03-26 DIAGNOSIS — R1310 Dysphagia, unspecified: Secondary | ICD-10-CM | POA: Diagnosis not present

## 2018-03-26 DIAGNOSIS — F411 Generalized anxiety disorder: Secondary | ICD-10-CM | POA: Diagnosis not present

## 2018-03-26 DIAGNOSIS — Z6826 Body mass index (BMI) 26.0-26.9, adult: Secondary | ICD-10-CM | POA: Diagnosis not present

## 2018-03-26 DIAGNOSIS — J301 Allergic rhinitis due to pollen: Secondary | ICD-10-CM | POA: Diagnosis not present

## 2018-04-03 DIAGNOSIS — M25851 Other specified joint disorders, right hip: Secondary | ICD-10-CM | POA: Diagnosis not present

## 2018-04-03 DIAGNOSIS — M160 Bilateral primary osteoarthritis of hip: Secondary | ICD-10-CM | POA: Diagnosis not present

## 2018-04-03 DIAGNOSIS — Z6826 Body mass index (BMI) 26.0-26.9, adult: Secondary | ICD-10-CM | POA: Diagnosis not present

## 2018-05-18 DIAGNOSIS — Z79899 Other long term (current) drug therapy: Secondary | ICD-10-CM | POA: Diagnosis not present

## 2018-05-18 DIAGNOSIS — I10 Essential (primary) hypertension: Secondary | ICD-10-CM | POA: Diagnosis not present

## 2018-05-18 DIAGNOSIS — M1990 Unspecified osteoarthritis, unspecified site: Secondary | ICD-10-CM | POA: Diagnosis not present

## 2018-05-18 DIAGNOSIS — F17200 Nicotine dependence, unspecified, uncomplicated: Secondary | ICD-10-CM | POA: Diagnosis not present

## 2018-05-18 DIAGNOSIS — R1310 Dysphagia, unspecified: Secondary | ICD-10-CM | POA: Diagnosis not present

## 2018-05-18 DIAGNOSIS — E7800 Pure hypercholesterolemia, unspecified: Secondary | ICD-10-CM | POA: Diagnosis not present

## 2018-05-18 DIAGNOSIS — K219 Gastro-esophageal reflux disease without esophagitis: Secondary | ICD-10-CM | POA: Diagnosis not present

## 2018-05-18 DIAGNOSIS — R42 Dizziness and giddiness: Secondary | ICD-10-CM | POA: Diagnosis not present

## 2018-05-18 DIAGNOSIS — J029 Acute pharyngitis, unspecified: Secondary | ICD-10-CM | POA: Diagnosis not present

## 2018-05-30 DIAGNOSIS — Z6826 Body mass index (BMI) 26.0-26.9, adult: Secondary | ICD-10-CM | POA: Diagnosis not present

## 2018-05-30 DIAGNOSIS — F1010 Alcohol abuse, uncomplicated: Secondary | ICD-10-CM | POA: Diagnosis not present

## 2018-05-30 DIAGNOSIS — F17210 Nicotine dependence, cigarettes, uncomplicated: Secondary | ICD-10-CM | POA: Diagnosis not present

## 2018-05-30 DIAGNOSIS — K219 Gastro-esophageal reflux disease without esophagitis: Secondary | ICD-10-CM | POA: Diagnosis not present

## 2018-05-31 ENCOUNTER — Emergency Department (HOSPITAL_COMMUNITY): Payer: Medicare HMO

## 2018-05-31 ENCOUNTER — Encounter (HOSPITAL_COMMUNITY): Payer: Self-pay | Admitting: Emergency Medicine

## 2018-05-31 ENCOUNTER — Emergency Department (HOSPITAL_COMMUNITY)
Admission: EM | Admit: 2018-05-31 | Discharge: 2018-05-31 | Disposition: A | Discharge status: Home / Self Care | Payer: Medicare HMO | Attending: Emergency Medicine | Admitting: Emergency Medicine

## 2018-05-31 ENCOUNTER — Other Ambulatory Visit: Payer: Self-pay

## 2018-05-31 DIAGNOSIS — Z7982 Long term (current) use of aspirin: Secondary | ICD-10-CM | POA: Diagnosis not present

## 2018-05-31 DIAGNOSIS — F17210 Nicotine dependence, cigarettes, uncomplicated: Secondary | ICD-10-CM | POA: Diagnosis not present

## 2018-05-31 DIAGNOSIS — Z8547 Personal history of malignant neoplasm of testis: Secondary | ICD-10-CM | POA: Diagnosis not present

## 2018-05-31 DIAGNOSIS — Z79899 Other long term (current) drug therapy: Secondary | ICD-10-CM | POA: Insufficient documentation

## 2018-05-31 DIAGNOSIS — Z8546 Personal history of malignant neoplasm of prostate: Secondary | ICD-10-CM | POA: Insufficient documentation

## 2018-05-31 DIAGNOSIS — R1314 Dysphagia, pharyngoesophageal phase: Secondary | ICD-10-CM | POA: Insufficient documentation

## 2018-05-31 DIAGNOSIS — R222 Localized swelling, mass and lump, trunk: Secondary | ICD-10-CM | POA: Diagnosis not present

## 2018-05-31 DIAGNOSIS — I318 Other specified diseases of pericardium: Secondary | ICD-10-CM | POA: Diagnosis not present

## 2018-05-31 DIAGNOSIS — R1310 Dysphagia, unspecified: Secondary | ICD-10-CM

## 2018-05-31 DIAGNOSIS — I10 Essential (primary) hypertension: Secondary | ICD-10-CM | POA: Diagnosis not present

## 2018-05-31 DIAGNOSIS — K219 Gastro-esophageal reflux disease without esophagitis: Secondary | ICD-10-CM | POA: Diagnosis not present

## 2018-05-31 DIAGNOSIS — R1319 Other dysphagia: Secondary | ICD-10-CM

## 2018-05-31 LAB — COMPREHENSIVE METABOLIC PANEL
ALT: 25 U/L (ref 0–44)
AST: 32 U/L (ref 15–41)
Albumin: 4.8 g/dL (ref 3.5–5.0)
Alkaline Phosphatase: 52 U/L (ref 38–126)
Anion gap: 6 (ref 5–15)
BUN: 14 mg/dL (ref 8–23)
CO2: 26 mmol/L (ref 22–32)
Calcium: 9.8 mg/dL (ref 8.9–10.3)
Chloride: 107 mmol/L (ref 98–111)
Creatinine, Ser: 1.09 mg/dL (ref 0.61–1.24)
GFR calc Af Amer: >60 mL/min (ref >60)
GFR calc non Af Amer: >60 mL/min (ref >60)
Glucose, Bld: 94 mg/dL (ref 70–99)
Potassium: 4.2 mmol/L (ref 3.5–5.1)
Sodium: 139 mmol/L (ref 135–145)
Total Bilirubin: 0.9 mg/dL (ref 0.3–1.2)
Total Protein: 7.6 g/dL (ref 6.5–8.1)

## 2018-05-31 LAB — CBC WITH DIFFERENTIAL/PLATELET
Basophils Absolute: 0 10*3/uL (ref 0.0–0.1)
Basophils Relative: 1 %
Eosinophils Absolute: 0.2 10*3/uL (ref 0.0–0.7)
Eosinophils Relative: 3 %
HCT: 41.4 % (ref 39.0–52.0)
Hemoglobin: 14.2 g/dL (ref 13.0–17.0)
Lymphocytes Relative: 21 %
Lymphs Abs: 1.5 10*3/uL (ref 0.7–4.0)
MCH: 31.4 pg (ref 26.0–34.0)
MCHC: 34.3 g/dL (ref 30.0–36.0)
MCV: 91.6 fL (ref 78.0–100.0)
Monocytes Absolute: 0.6 10*3/uL (ref 0.1–1.0)
Monocytes Relative: 8 %
Neutro Abs: 4.7 10*3/uL (ref 1.7–7.7)
Neutrophils Relative %: 67 %
Platelets: 268 10*3/uL (ref 150–400)
RBC: 4.52 MIL/uL (ref 4.22–5.81)
RDW: 12.2 % (ref 11.5–15.5)
WBC: 7 10*3/uL (ref 4.0–10.5)

## 2018-05-31 MED ORDER — IOHEXOL 300 MG/ML  SOLN
60.0000 mL | Freq: Once | INTRAMUSCULAR | Status: AC | PRN
  Administered 2018-05-31: 60 mL via INTRAVENOUS

## 2018-05-31 MED ORDER — SODIUM CHLORIDE 0.9 % IV BOLUS
1000.0000 mL | Freq: Once | INTRAVENOUS | Status: AC
  Administered 2018-05-31: 1000 mL via INTRAVENOUS

## 2018-05-31 MED ORDER — IOHEXOL 300 MG/ML  SOLN
75.0000 mL | Freq: Once | INTRAMUSCULAR | Status: AC | PRN
  Administered 2018-05-31: 75 mL via INTRAVENOUS

## 2018-05-31 NOTE — Discharge Instructions (Addendum)
Follow-up with Dr. Laural Golden for your swallowing problems.  And follow-up with your family doctor to keep a check on the cyst in your chest

## 2018-05-31 NOTE — ED Provider Notes (Signed)
[facility] EMERGENCY DEPARTMENT Provider Note   CSN: [id number] Arrival date & time: 05/31/18  1005     History   Chief Complaint Chief Complaint  Patient presents with  . Dysphagia    HPI ODYSSEUS CADA is a 67 y.o. male.  Patient has been having pain with swallowing for months.  He has been evaluated several times.  He has not had endoscopy.  The history is provided by the patient. No language interpreter was used.  Illness  This is a recurrent problem. The current episode started more than 2 days ago. The problem occurs constantly. The problem has not changed since onset.Pertinent negatives include no chest pain, no abdominal pain and no headaches. Nothing aggravates the symptoms. Nothing relieves the symptoms. He has tried nothing for the symptoms. The treatment provided no relief.    Past Medical History:  Diagnosis Date  . Arthritis   . Cancer [facility])    prostate, testicular  . GERD (gastroesophageal reflux disease)   . Hyperlipemia   . Hypertension     There are no active problems to display for this patient.   Past Surgical History:  Procedure Laterality Date  . BACK SURGERY    . COLONOSCOPY  01/21/2012   Procedure: COLONOSCOPY;  Surgeon: Danie Binder, MD;  Location: [facility];  Service: Endoscopy;  Laterality: N/A;  11:15 AM  . ORCHIECTOMY    . PROSTATECTOMY          Home Medications    Prior to Admission medications   Medication Sig Start Date End Date Taking? Authorizing Provider  aspirin EC 81 MG tablet Take 81 mg by mouth daily.   Yes [provider]  lisinopril (PRINIVIL,ZESTRIL) 20 MG tablet Take 20 mg by mouth 2 (two) times daily.    Yes [provider]  meloxicam (MOBIC) 15 MG tablet Take 15 mg by mouth daily.   Yes [provider]  Omega-3 Fatty Acids (OMEGA 3 500 PO) Take by mouth.   Yes [provider]  ranitidine (ZANTAC) 150 MG tablet Take 150 mg by mouth 2 (two) times daily.   Yes [provider]  rosuvastatin (CRESTOR) 5 MG tablet Take 1 tablet (5 mg total) by mouth daily. 05/07/17 12/18/17  Herminio Commons, MD    Family History Family History  Problem Relation Age of Onset  . Alzheimer's disease Mother   . Heart attack Father   . Congestive Heart Failure Father   . Prostate cancer Brother   . Alzheimer's disease Paternal Grandmother     Social History Social History   Tobacco Use  . Smoking status: Current Some Day Smoker    Packs/day: 0.20  . Smokeless tobacco: Never Used  . Tobacco comment: just smokes when he drinks not often   Substance Use Topics  . Alcohol use: Yes    Comment: occasional  . Drug use: No     Allergies   Patient has no known allergies.   Review of Systems Review of Systems  Constitutional: Negative for appetite change and fatigue.  HENT: Negative for congestion, ear discharge and sinus pressure.        Pain with swallowing  Eyes: Negative for discharge.  Respiratory: Negative for cough.   Cardiovascular: Negative for chest pain.  Gastrointestinal: Negative for abdominal pain and diarrhea.  Genitourinary: Negative for frequency and hematuria.  Musculoskeletal: Negative for back pain.  Skin: Negative for rash.  Neurological: Negative for seizures and headaches.  Psychiatric/Behavioral: Negative for  hallucinations.     Physical Exam Updated Vital Signs BP (!) 170/97 (BP Location: Right Arm)   Pulse 78   Temp 97.9 F (36.6 C) (Oral)   Resp 16   Ht 5\' 6"  (1.676 m)   Wt 72.6 kg   SpO2 100%   BMI 25.82 kg/m   Physical Exam  Constitutional: He is oriented to person, place, and time. He appears well-developed.  HENT:  Head: Normocephalic.  Eyes: Conjunctivae and EOM are normal. No scleral icterus.  Neck: Neck supple. No thyromegaly present.  Cardiovascular: Normal rate and regular rhythm. Exam reveals no gallop and no friction rub.  No murmur heard. Pulmonary/Chest: No stridor. He has no wheezes. He has no  rales. He exhibits no tenderness.  Abdominal: He exhibits no distension. There is no tenderness. There is no rebound.  Musculoskeletal: Normal range of motion. He exhibits no edema.  Lymphadenopathy:    He has no cervical adenopathy.  Neurological: He is oriented to person, place, and time. He exhibits normal muscle tone. Coordination normal.  Skin: No rash noted. No erythema.  Psychiatric: He has a normal mood and affect. His behavior is normal.     ED Treatments / Results  Labs (all labs ordered are listed, but only abnormal results are displayed) Labs Reviewed  CBC WITH DIFFERENTIAL/PLATELET  COMPREHENSIVE METABOLIC PANEL    EKG None  Radiology Ct Soft Tissue Neck W Contrast  Result Date: 05/31/2018 CLINICAL DATA:  Difficulty swallowing for 1 week EXAM: CT NECK WITH CONTRAST TECHNIQUE: Multidetector CT imaging of the neck was performed using the standard protocol following the bolus administration of intravenous contrast. CONTRAST:  10mL OMNIPAQUE IOHEXOL 300 MG/ML  SOLN COMPARISON:  05/18/2018 soft tissue neck. FINDINGS: Pharynx and larynx: No evidence of mass or swelling. The airway is widely patent. Salivary glands: No inflammation, mass, or stone. Thyroid: Normal. Lymph nodes: None cervical nodal enlargement or abnormal density. Vascular: Atherosclerotic plaque at the carotid bifurcations. Calcified plaque at the V4 segments. No acute finding Limited intracranial: Negative Visualized orbits: Bilateral cataract resection Mastoids and visualized paranasal sinuses: Clear Skeleton: No acute or aggressive finding. There is upper cervical facet arthropathy and lower cervical and upper thoracic predominant disc degeneration. Upper chest: Cystic density without perceptible wall at the AP window, measuring up to 4.5 cm. This would be an atypical shape for pericardial recess and no mass was seen in this area on a 2011 chest radiograph. 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 IMPRESSION: 1. No acute finding. No swelling, mass, or narrowing seen in the neck. 2. 4.5 cm mediastinal cystic structure at the AP window that is partially covered. Hopefully this is a forget duplication cyst but no mass was seen in this area on 2011 chest radiograph. Recommend chest CT with contrast to exclude cystic neoplasm or adenopathy. Electronically Signed   By: Monte Fantasia M.D.   On: 05/31/2018 13:14   Ct Chest W Contrast  Result Date: 05/31/2018 CLINICAL DATA:  Abnormal neck CT today EXAM: CT CHEST WITH CONTRAST TECHNIQUE: Multidetector CT imaging of the chest was performed during intravenous contrast administration. CONTRAST:  68mL OMNIPAQUE IOHEXOL 300 MG/ML  SOLN COMPARISON:  Neck CT today and a lumbar spine CT 2011. FINDINGS: Cardiovascular: Heart is normal size. Calcified plaque is present over the lateral circumflex and left anterior descending coronary arteries. Ascending thoracic aorta measures 3.7 cm in AP diameter. Minimal  calcified plaque over the descending thoracic aorta. Remaining vascular structures are unremarkable. Mediastinum/Nodes: Superior pericardial recess is present. There is a cystic lobulated somewhat tubular structure over the AP window measuring approximately 3.7 x 2.9 cm in AP and transverse dimension. There is a solitary 2 mm calcification along its inferior border. This may communicate with the superior pericardial recess. This likely represents a benign process and may be due to left pulmonary pericardial recess versus forgut duplication cyst or cystic adenopathy. Lungs/Pleura: Lungs are well inflated without focal airspace consolidation or effusion. No pulmonary nodules/masses. Airways are normal. Upper Abdomen: Calcified plaque over the abdominal aorta. Subcentimeter hypodensity over the upper pole right kidney too small to characterize but likely a cyst. No  acute findings. Musculoskeletal: Degenerate changes spine with anterior wedging of a vertebral body near the thoracolumbar junction without significant change. IMPRESSION: No acute cardiopulmonary disease. Cystic structure over the AP window measuring 2.9 x 3.7 cm with lobulated tubular shape and possible communication with superior pericardial recess. This is likely a benign process and may represent a left pulmonic pericardial recess versus forgut duplication cyst or cystic adenopathy. MRI may be helpful for further evaluation versus follow-up CT 2 months. Aortic Atherosclerosis (ICD10-I70.0). Atherosclerotic coronary artery disease. Mild ectasia of the ascending thoracic aorta measuring 3.7 cm. Recommend annual imaging followup by CTA or MRA. This recommendation follows 2010 ACCF/AHA/AATS/ACR/ASA/SCA/SCAI/SIR/STS/SVM Guidelines for the Diagnosis and Management of Patients with Thoracic Aortic Disease. Circulation.2010; 121: B048-G891. Subcentimeter right renal cortical hypodensity too small to characterize but likely a cyst. Electronically Signed   By: Marin Olp M.D.   On: 05/31/2018 14:42    Procedures Procedures (including critical care time)  Medications Ordered in ED Medications  sodium chloride 0.9 % bolus 1,000 mL ( Intravenous Stopped 05/31/18 1234)  iohexol (OMNIPAQUE) 300 MG/ML solution 75 mL (75 mLs Intravenous Contrast Given 05/31/18 1243)  iohexol (OMNIPAQUE) 300 MG/ML solution 60 mL (60 mLs Intravenous Contrast Given 05/31/18 1343)     Initial Impression / Assessment and Plan / ED Course  I have reviewed the triage vital signs and the nursing notes.  Pertinent labs & imaging results that were available during my care of the patient were reviewed by me and considered in my medical decision making (see chart for details).     Labs unremarkable.  CT of the neck showed possible cyst.  It was recommended to get a CT of her chest.  The CT of the chest also shows a cyst in his chest  which is probably benign.  Recommendation is possible MRI or repeat CT in 2 months.  Patient is referred to GI for his difficulty swallowing and will follow up with his primary care doctor also  Final Clinical Impressions(s) / ED Diagnoses   Final diagnoses:  Esophageal dysphagia    ED Discharge Orders    None       Milton Ferguson, MD 05/31/18 1559

## 2018-05-31 NOTE — ED Notes (Signed)
Patient transported to X-ray 

## 2018-05-31 NOTE — ED Triage Notes (Signed)
Patient c/o difficulty swallowing x1 week. Per patient seen at [facility] ED twice and by PCP twice for it with no diagnoses. Patient states given [employer] Magic Mouth Wash with no relief. Patient states "I feels like it's swallowing and the only thing I can swallow well is water. It also seems to get worse when I get upset and my blood pressure goes up." Patient able to swallow medications and handle oral secretions.

## 2018-06-09 DIAGNOSIS — Z6826 Body mass index (BMI) 26.0-26.9, adult: Secondary | ICD-10-CM | POA: Diagnosis not present

## 2018-06-09 DIAGNOSIS — I10 Essential (primary) hypertension: Secondary | ICD-10-CM | POA: Diagnosis not present

## 2018-06-09 DIAGNOSIS — I77810 Thoracic aortic ectasia: Secondary | ICD-10-CM | POA: Diagnosis not present

## 2018-06-09 DIAGNOSIS — Z23 Encounter for immunization: Secondary | ICD-10-CM | POA: Diagnosis not present

## 2018-06-09 DIAGNOSIS — E782 Mixed hyperlipidemia: Secondary | ICD-10-CM | POA: Diagnosis not present

## 2018-06-09 DIAGNOSIS — I700 Atherosclerosis of aorta: Secondary | ICD-10-CM | POA: Diagnosis not present

## 2018-06-09 DIAGNOSIS — I2510 Atherosclerotic heart disease of native coronary artery without angina pectoris: Secondary | ICD-10-CM | POA: Diagnosis not present

## 2018-06-09 DIAGNOSIS — R1310 Dysphagia, unspecified: Secondary | ICD-10-CM | POA: Diagnosis not present

## 2018-06-17 DIAGNOSIS — Z6825 Body mass index (BMI) 25.0-25.9, adult: Secondary | ICD-10-CM | POA: Diagnosis not present

## 2018-06-17 DIAGNOSIS — K529 Noninfective gastroenteritis and colitis, unspecified: Secondary | ICD-10-CM | POA: Diagnosis not present

## 2018-06-18 ENCOUNTER — Ambulatory Visit (INDEPENDENT_AMBULATORY_CARE_PROVIDER_SITE_OTHER): Payer: Medicare HMO | Admitting: Internal Medicine

## 2018-06-18 ENCOUNTER — Encounter (INDEPENDENT_AMBULATORY_CARE_PROVIDER_SITE_OTHER): Payer: Self-pay | Admitting: Internal Medicine

## 2018-06-18 ENCOUNTER — Encounter (INDEPENDENT_AMBULATORY_CARE_PROVIDER_SITE_OTHER): Payer: Self-pay | Admitting: *Deleted

## 2018-06-18 DIAGNOSIS — R1310 Dysphagia, unspecified: Secondary | ICD-10-CM | POA: Insufficient documentation

## 2018-06-18 DIAGNOSIS — R1319 Other dysphagia: Secondary | ICD-10-CM

## 2018-06-18 NOTE — Patient Instructions (Signed)
DG esophagram.   

## 2018-06-18 NOTE — Progress Notes (Signed)
   Subjective:    Patient ID: Timothy Nguyen, male    DOB: Mar 22, 1951, 67 y.o.   MRN: [id number]  HPI Referred by the ED for esophageal dysphagia. Pain with swallowing for a couple of weeks.  Never had an endoscopy.  Stopped the Meloxicam since having stomach trouble.  He says he was having trouble swallowing but this has almost resolved. He started a nasal spray and his symptoms improved.  He says he can eat anything he wants. No trouble eating steak.  His appetite is good. Weight loss last couple of days (3-4 lbs) because of diarrhea. Has had diarrhea for about 4 days.  States not all his stools are loose. Having some formed stool. No fever. Having 2-3 stools a day.  Started Imodium AD and diarrhea is better.  States he use to drink etoh heavy. Drink 1/2 pint liquor 3-4 days a week.  Hx of prostate and testicular cancer.  One brother has had prostate cancer.     Review of Systems Past Medical History:  Diagnosis Date  . Arthritis   . Cancer [facility])    prostate, testicular  . GERD (gastroesophageal reflux disease)   . Hyperlipemia   . Hypertension     Past Surgical History:  Procedure Laterality Date  . BACK SURGERY    . COLONOSCOPY  01/21/2012   Procedure: COLONOSCOPY;  Surgeon: Danie Binder, MD;  Location: [facility];  Service: Endoscopy;  Laterality: N/A;  11:15 AM  . ORCHIECTOMY    . PROSTATECTOMY      No Known Allergies  Current Outpatient Medications on File Prior to Visit  Medication Sig Dispense Refill  . lisinopril (PRINIVIL,ZESTRIL) 20 MG tablet Take 20 mg by mouth 2 (two) times daily.     . Omega-3 Fatty Acids (OMEGA 3 500 PO) Take by mouth.    . ranitidine (ZANTAC) 150 MG tablet Take 150 mg by mouth 2 (two) times daily.    . rosuvastatin (CRESTOR) 5 MG tablet Take 1 tablet (5 mg total) by mouth daily. 30 tablet 6   No current facility-administered medications on file prior to visit.         Objective:   Physical Exam Blood pressure (!) 150/70, pulse  68, temperature 98 F (36.7 C), height 5\' 6"  (1.676 m), weight 155 lb 3.2 oz (70.4 kg).  Alert and oriented. Skin warm and dry. Oral mucosa is moist.   . Sclera anicteric, conjunctivae is pink. Thyroid not enlarged. No cervical lymphadenopathy. Lungs clear. Heart regular rate and rhythm.  Abdomen is soft. Bowel sounds are positive. No hepatomegaly. No abdominal masses felt. No tenderness.  No edema to lower extremities.          Assessment & Plan:  Dysphagia: Am going to get a DG esophagram.. GERD: Continue the Zantac

## 2018-06-24 ENCOUNTER — Ambulatory Visit (HOSPITAL_COMMUNITY)
Admission: RE | Admit: 2018-06-24 | Discharge: 2018-06-24 | Disposition: A | Discharge status: Home / Self Care | Payer: Medicare HMO | Source: Ambulatory Visit | Attending: Internal Medicine | Admitting: Internal Medicine

## 2018-06-24 DIAGNOSIS — R1319 Other dysphagia: Secondary | ICD-10-CM

## 2018-06-24 DIAGNOSIS — K224 Dyskinesia of esophagus: Secondary | ICD-10-CM | POA: Diagnosis not present

## 2018-06-24 DIAGNOSIS — R1310 Dysphagia, unspecified: Secondary | ICD-10-CM | POA: Diagnosis not present

## 2018-07-17 DIAGNOSIS — F17210 Nicotine dependence, cigarettes, uncomplicated: Secondary | ICD-10-CM | POA: Diagnosis not present

## 2018-07-17 DIAGNOSIS — I10 Essential (primary) hypertension: Secondary | ICD-10-CM | POA: Diagnosis not present

## 2018-07-17 DIAGNOSIS — Z6826 Body mass index (BMI) 26.0-26.9, adult: Secondary | ICD-10-CM | POA: Diagnosis not present

## 2018-07-17 DIAGNOSIS — Z0001 Encounter for general adult medical examination with abnormal findings: Secondary | ICD-10-CM | POA: Diagnosis not present

## 2018-09-08 DIAGNOSIS — M25512 Pain in left shoulder: Secondary | ICD-10-CM | POA: Diagnosis not present

## 2018-09-08 DIAGNOSIS — M542 Cervicalgia: Secondary | ICD-10-CM | POA: Diagnosis not present

## 2018-09-08 DIAGNOSIS — G8929 Other chronic pain: Secondary | ICD-10-CM | POA: Diagnosis not present

## 2018-09-24 DIAGNOSIS — M6281 Muscle weakness (generalized): Secondary | ICD-10-CM | POA: Diagnosis not present

## 2018-09-24 DIAGNOSIS — M799 Soft tissue disorder, unspecified: Secondary | ICD-10-CM | POA: Diagnosis not present

## 2018-09-24 DIAGNOSIS — M542 Cervicalgia: Secondary | ICD-10-CM | POA: Diagnosis not present

## 2018-09-24 DIAGNOSIS — M5412 Radiculopathy, cervical region: Secondary | ICD-10-CM | POA: Diagnosis not present

## 2018-09-29 DIAGNOSIS — M542 Cervicalgia: Secondary | ICD-10-CM | POA: Diagnosis not present

## 2018-09-29 DIAGNOSIS — M6281 Muscle weakness (generalized): Secondary | ICD-10-CM | POA: Diagnosis not present

## 2018-09-29 DIAGNOSIS — M799 Soft tissue disorder, unspecified: Secondary | ICD-10-CM | POA: Diagnosis not present

## 2018-09-29 DIAGNOSIS — M5412 Radiculopathy, cervical region: Secondary | ICD-10-CM | POA: Diagnosis not present

## 2018-10-01 DIAGNOSIS — M799 Soft tissue disorder, unspecified: Secondary | ICD-10-CM | POA: Diagnosis not present

## 2018-10-01 DIAGNOSIS — M5412 Radiculopathy, cervical region: Secondary | ICD-10-CM | POA: Diagnosis not present

## 2018-10-01 DIAGNOSIS — M542 Cervicalgia: Secondary | ICD-10-CM | POA: Diagnosis not present

## 2018-10-01 DIAGNOSIS — M6281 Muscle weakness (generalized): Secondary | ICD-10-CM | POA: Diagnosis not present

## 2018-10-06 DIAGNOSIS — M799 Soft tissue disorder, unspecified: Secondary | ICD-10-CM | POA: Diagnosis not present

## 2018-10-06 DIAGNOSIS — M5412 Radiculopathy, cervical region: Secondary | ICD-10-CM | POA: Diagnosis not present

## 2018-10-06 DIAGNOSIS — M6281 Muscle weakness (generalized): Secondary | ICD-10-CM | POA: Diagnosis not present

## 2018-10-06 DIAGNOSIS — M542 Cervicalgia: Secondary | ICD-10-CM | POA: Diagnosis not present

## 2018-10-08 DIAGNOSIS — M6281 Muscle weakness (generalized): Secondary | ICD-10-CM | POA: Diagnosis not present

## 2018-10-08 DIAGNOSIS — M542 Cervicalgia: Secondary | ICD-10-CM | POA: Diagnosis not present

## 2018-10-08 DIAGNOSIS — M5412 Radiculopathy, cervical region: Secondary | ICD-10-CM | POA: Diagnosis not present

## 2018-10-08 DIAGNOSIS — M799 Soft tissue disorder, unspecified: Secondary | ICD-10-CM | POA: Diagnosis not present

## 2018-10-09 DIAGNOSIS — M799 Soft tissue disorder, unspecified: Secondary | ICD-10-CM | POA: Diagnosis not present

## 2018-10-09 DIAGNOSIS — M6281 Muscle weakness (generalized): Secondary | ICD-10-CM | POA: Diagnosis not present

## 2018-10-09 DIAGNOSIS — M5412 Radiculopathy, cervical region: Secondary | ICD-10-CM | POA: Diagnosis not present

## 2018-10-09 DIAGNOSIS — M542 Cervicalgia: Secondary | ICD-10-CM | POA: Diagnosis not present

## 2018-10-13 DIAGNOSIS — M6281 Muscle weakness (generalized): Secondary | ICD-10-CM | POA: Diagnosis not present

## 2018-10-13 DIAGNOSIS — M542 Cervicalgia: Secondary | ICD-10-CM | POA: Diagnosis not present

## 2018-10-13 DIAGNOSIS — M5412 Radiculopathy, cervical region: Secondary | ICD-10-CM | POA: Diagnosis not present

## 2018-10-13 DIAGNOSIS — M799 Soft tissue disorder, unspecified: Secondary | ICD-10-CM | POA: Diagnosis not present

## 2018-10-16 DIAGNOSIS — M799 Soft tissue disorder, unspecified: Secondary | ICD-10-CM | POA: Diagnosis not present

## 2018-10-16 DIAGNOSIS — M542 Cervicalgia: Secondary | ICD-10-CM | POA: Diagnosis not present

## 2018-10-16 DIAGNOSIS — M5412 Radiculopathy, cervical region: Secondary | ICD-10-CM | POA: Diagnosis not present

## 2018-10-16 DIAGNOSIS — M6281 Muscle weakness (generalized): Secondary | ICD-10-CM | POA: Diagnosis not present

## 2018-10-22 DIAGNOSIS — M799 Soft tissue disorder, unspecified: Secondary | ICD-10-CM | POA: Diagnosis not present

## 2018-10-22 DIAGNOSIS — M5412 Radiculopathy, cervical region: Secondary | ICD-10-CM | POA: Diagnosis not present

## 2018-10-22 DIAGNOSIS — M6281 Muscle weakness (generalized): Secondary | ICD-10-CM | POA: Diagnosis not present

## 2018-10-22 DIAGNOSIS — M542 Cervicalgia: Secondary | ICD-10-CM | POA: Diagnosis not present

## 2018-10-24 DIAGNOSIS — M542 Cervicalgia: Secondary | ICD-10-CM | POA: Diagnosis not present

## 2018-10-24 DIAGNOSIS — M6281 Muscle weakness (generalized): Secondary | ICD-10-CM | POA: Diagnosis not present

## 2018-10-24 DIAGNOSIS — M799 Soft tissue disorder, unspecified: Secondary | ICD-10-CM | POA: Diagnosis not present

## 2018-10-24 DIAGNOSIS — M5412 Radiculopathy, cervical region: Secondary | ICD-10-CM | POA: Diagnosis not present

## 2018-10-27 DIAGNOSIS — M6281 Muscle weakness (generalized): Secondary | ICD-10-CM | POA: Diagnosis not present

## 2018-10-27 DIAGNOSIS — M799 Soft tissue disorder, unspecified: Secondary | ICD-10-CM | POA: Diagnosis not present

## 2018-10-27 DIAGNOSIS — M542 Cervicalgia: Secondary | ICD-10-CM | POA: Diagnosis not present

## 2018-10-27 DIAGNOSIS — M5412 Radiculopathy, cervical region: Secondary | ICD-10-CM | POA: Diagnosis not present

## 2018-10-30 DIAGNOSIS — M6281 Muscle weakness (generalized): Secondary | ICD-10-CM | POA: Diagnosis not present

## 2018-10-30 DIAGNOSIS — M542 Cervicalgia: Secondary | ICD-10-CM | POA: Diagnosis not present

## 2018-10-30 DIAGNOSIS — M5412 Radiculopathy, cervical region: Secondary | ICD-10-CM | POA: Diagnosis not present

## 2018-10-30 DIAGNOSIS — M799 Soft tissue disorder, unspecified: Secondary | ICD-10-CM | POA: Diagnosis not present

## 2018-11-04 DIAGNOSIS — M6281 Muscle weakness (generalized): Secondary | ICD-10-CM | POA: Diagnosis not present

## 2018-11-04 DIAGNOSIS — M5412 Radiculopathy, cervical region: Secondary | ICD-10-CM | POA: Diagnosis not present

## 2018-11-04 DIAGNOSIS — M542 Cervicalgia: Secondary | ICD-10-CM | POA: Diagnosis not present

## 2018-11-04 DIAGNOSIS — M799 Soft tissue disorder, unspecified: Secondary | ICD-10-CM | POA: Diagnosis not present

## 2018-11-06 DIAGNOSIS — M799 Soft tissue disorder, unspecified: Secondary | ICD-10-CM | POA: Diagnosis not present

## 2018-11-06 DIAGNOSIS — M5412 Radiculopathy, cervical region: Secondary | ICD-10-CM | POA: Diagnosis not present

## 2018-11-06 DIAGNOSIS — M6281 Muscle weakness (generalized): Secondary | ICD-10-CM | POA: Diagnosis not present

## 2018-11-06 DIAGNOSIS — M542 Cervicalgia: Secondary | ICD-10-CM | POA: Diagnosis not present

## 2018-11-10 DIAGNOSIS — M5412 Radiculopathy, cervical region: Secondary | ICD-10-CM | POA: Diagnosis not present

## 2018-11-10 DIAGNOSIS — M542 Cervicalgia: Secondary | ICD-10-CM | POA: Diagnosis not present

## 2018-11-10 DIAGNOSIS — M6281 Muscle weakness (generalized): Secondary | ICD-10-CM | POA: Diagnosis not present

## 2018-11-10 DIAGNOSIS — M799 Soft tissue disorder, unspecified: Secondary | ICD-10-CM | POA: Diagnosis not present

## 2018-11-12 DIAGNOSIS — M799 Soft tissue disorder, unspecified: Secondary | ICD-10-CM | POA: Diagnosis not present

## 2018-11-12 DIAGNOSIS — M6281 Muscle weakness (generalized): Secondary | ICD-10-CM | POA: Diagnosis not present

## 2018-11-12 DIAGNOSIS — M542 Cervicalgia: Secondary | ICD-10-CM | POA: Diagnosis not present

## 2018-11-12 DIAGNOSIS — M5412 Radiculopathy, cervical region: Secondary | ICD-10-CM | POA: Diagnosis not present

## 2018-12-30 DIAGNOSIS — I10 Essential (primary) hypertension: Secondary | ICD-10-CM | POA: Diagnosis not present

## 2018-12-30 DIAGNOSIS — F1010 Alcohol abuse, uncomplicated: Secondary | ICD-10-CM | POA: Diagnosis not present

## 2018-12-30 DIAGNOSIS — E782 Mixed hyperlipidemia: Secondary | ICD-10-CM | POA: Diagnosis not present

## 2018-12-30 DIAGNOSIS — F17210 Nicotine dependence, cigarettes, uncomplicated: Secondary | ICD-10-CM | POA: Diagnosis not present

## 2018-12-30 DIAGNOSIS — K219 Gastro-esophageal reflux disease without esophagitis: Secondary | ICD-10-CM | POA: Diagnosis not present

## 2018-12-30 DIAGNOSIS — N400 Enlarged prostate without lower urinary tract symptoms: Secondary | ICD-10-CM | POA: Diagnosis not present

## 2019-01-07 IMAGING — RF DG ESOPHAGUS
11 of 12 series · 14 of 24 positions shown · non-contrast
Comparison: Neck CT 05/31/2018.  Chest CT 05/31/2018

CLINICAL DATA: Dysphagia. Sense of things getting stuck in throat
for 2-3 weeks.

EXAM:
ESOPHOGRAM / BARIUM SWALLOW / BARIUM TABLET STUDY
TECHNIQUE: Combined double contrast and single contrast examination performed
using effervescent crystals, thick barium liquid, and thin barium
liquid. The patient was observed with fluoroscopy swallowing a 13 mm
barium sulphate tablet.
FLUOROSCOPY TIME:  Fluoroscopy Time:  2 minutes and 36 seconds
Radiation Exposure Index (if provided by the fluoroscopic device):
Not applicable.
Number of Acquired Spot Images: None

[Series 1: cp_standard · 0.17mm/px · 1 of 31 frames shown (1 of 11)]
[frame 5/31]
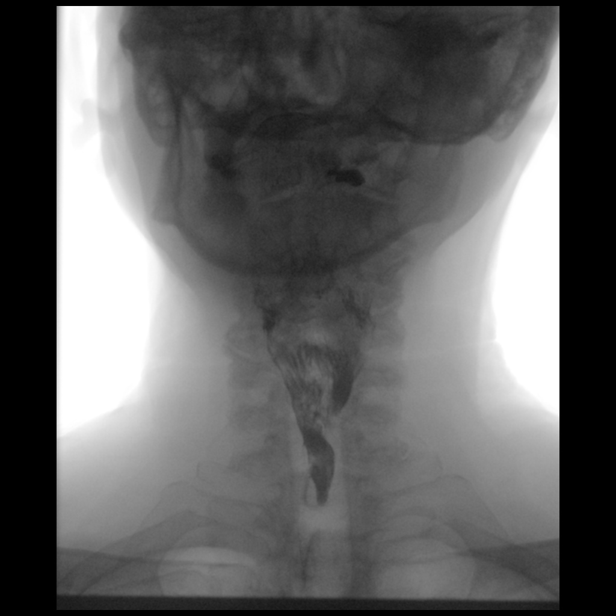

[Series 2: cp_standard · 0.17mm/px · 2 of 27 frames shown (2 of 11)]
[frame 5/27]
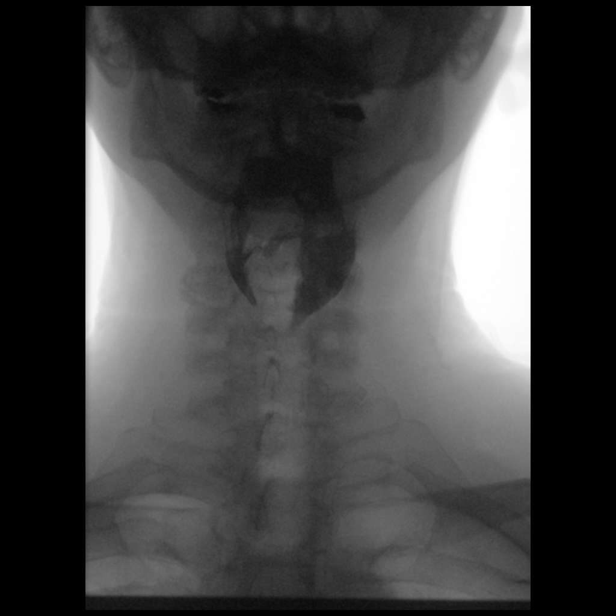
[frame 23/27]
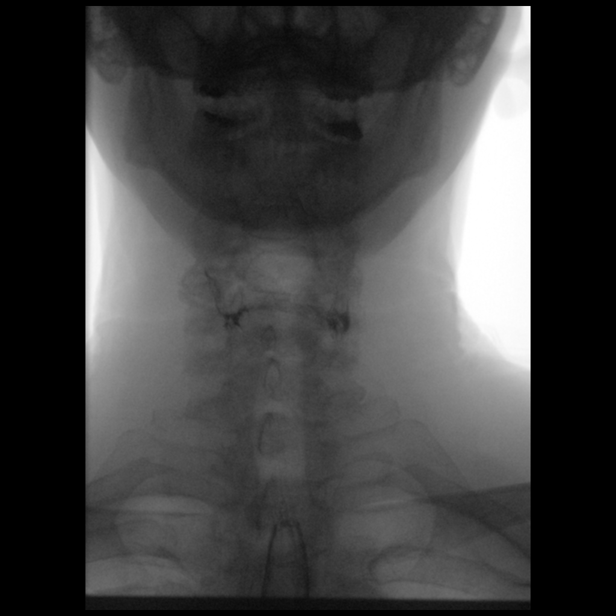

[Series 3: cp_standard · 0.17mm/px · 1 of 49 frames shown (3 of 11)]
[frame 42/49]
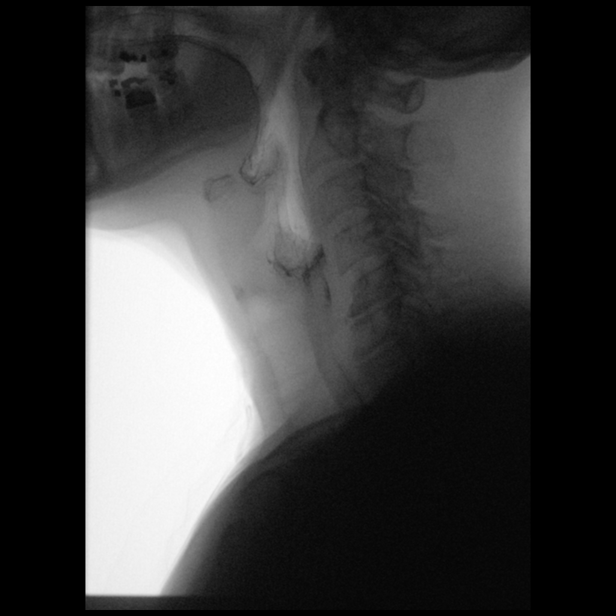

[Series 4: cp_standard · 0.25mm/px · 1 of 162 frames shown (4 of 11)]
[frame 25/162]
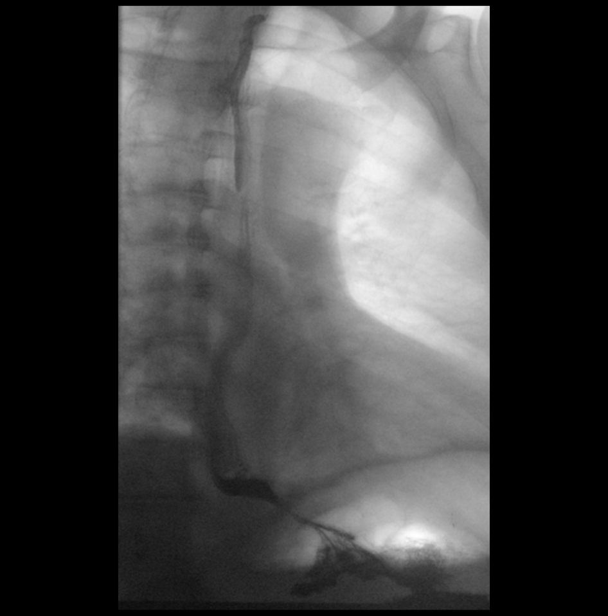

[Series 5: cp_standard · 0.25mm/px · 1 of 1 slices shown (5 of 11)]
[im 1/1]
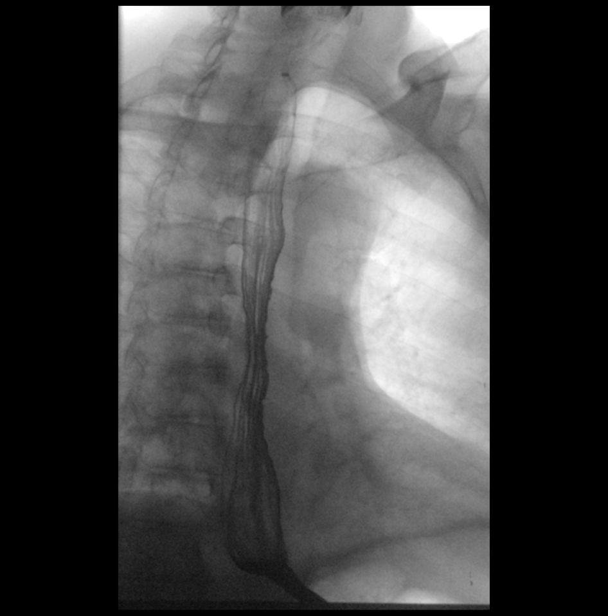

[Series 6: cp_standard · 0.25mm/px · 1 of 173 frames shown (6 of 11)]
[frame 148/173]
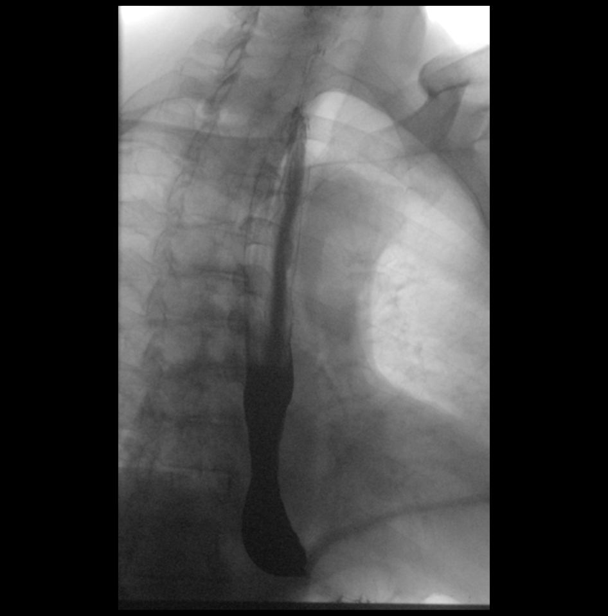

[Series 7: cp_standard · 0.27mm/px · 1 of 129 frames shown (7 of 11)]
[frame 20/129]
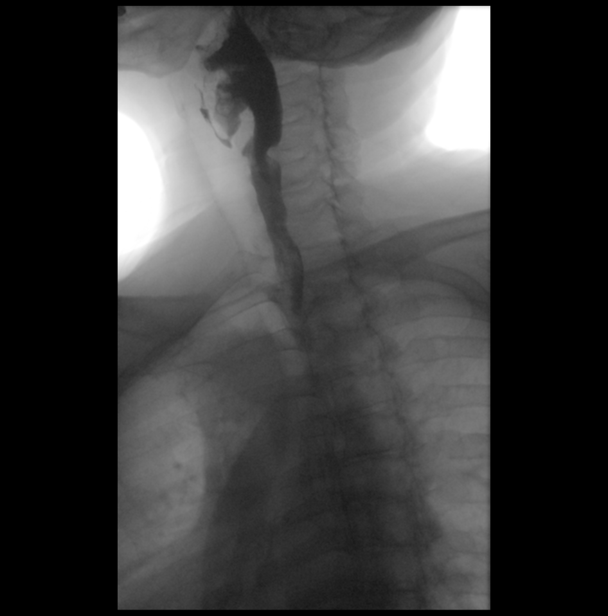

[Series 8: cp_standard · 0.27mm/px · 1 of 212 frames shown (8 of 11)]
[frame 1/212]
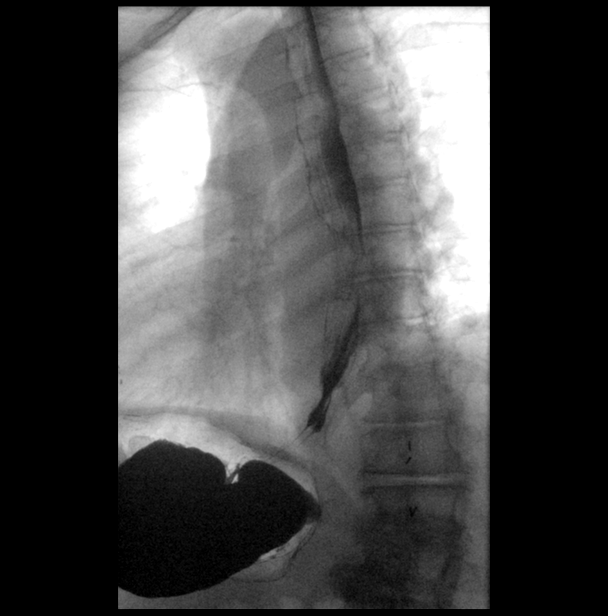

[Series 9: cp_standard · 0.28mm/px · 2 of 216 frames shown (9 of 11)]
[frame 3/216]
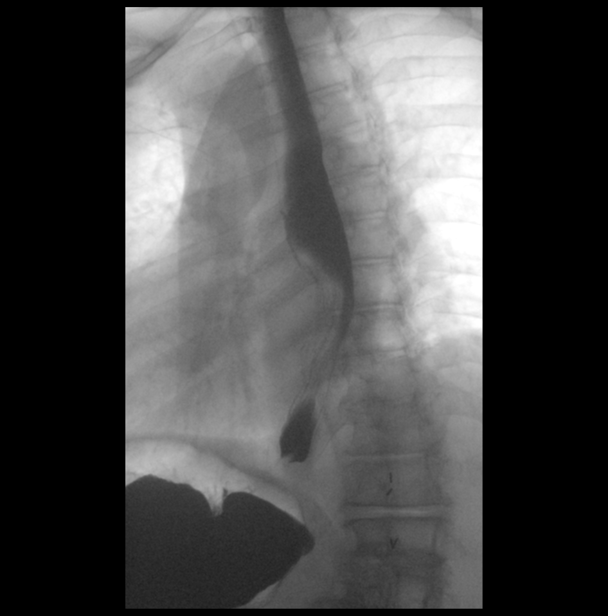
[frame 184/216]
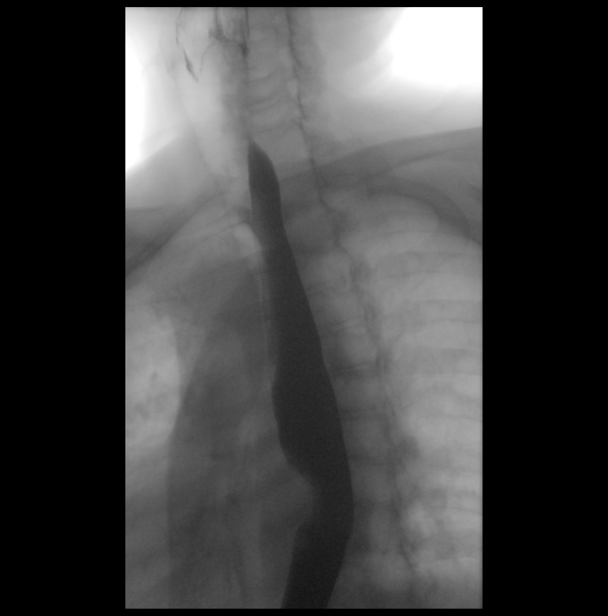

[Series 11: cp_standard · 0.28mm/px · 2 of 154 frames shown (10 of 11)]
[frame 24/154]
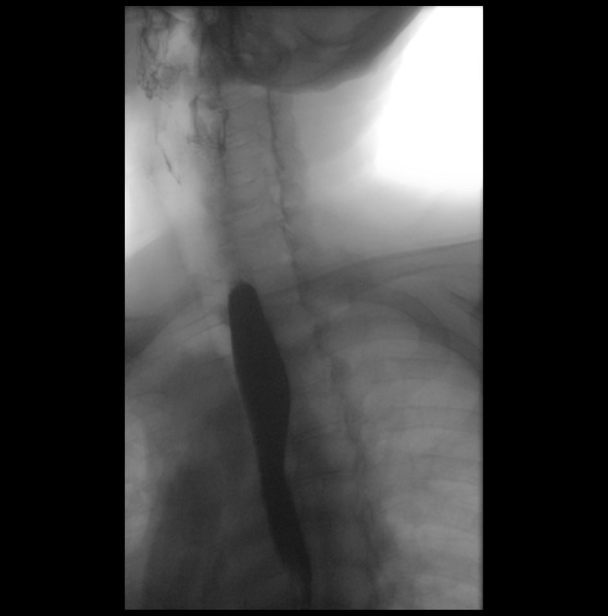
[frame 154/154]
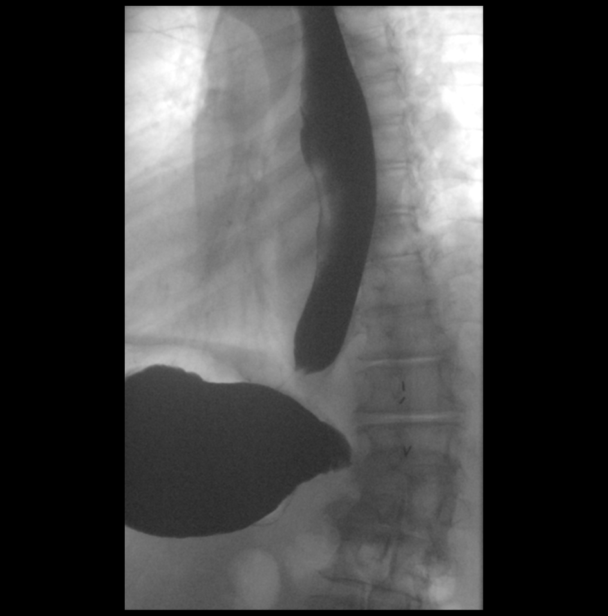

[Series 13: cp_standard · 0.26mm/px · 1 of 1 slices shown (11 of 11)]
[im 1/1]
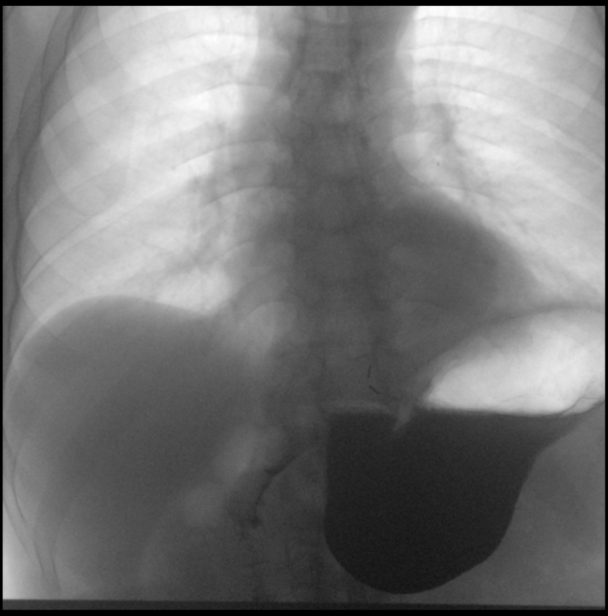

[14 of 24 positions shown; findings below may reference images not displayed]

FINDINGS: Hypopharyngeal portion of the exam is unremarkable.

Double contrast evaluation of the esophagus demonstrates no mucosal
abnormality.

Evaluation of esophageal peristalsis demonstrates minimal proximal
escape waves with contrast stasis in the mid esophagus.

Full column evaluation of the esophagus demonstrates no persistent
narrowing or stricture.

A 13 mm barium tablet passes promptly.
IMPRESSION: 1. Relatively mild esophageal dysmotility, likely presbyesophagus.
2. Otherwise, normal esophagram for age.

## 2019-01-14 DIAGNOSIS — M4802 Spinal stenosis, cervical region: Secondary | ICD-10-CM | POA: Diagnosis not present

## 2019-01-14 DIAGNOSIS — I77810 Thoracic aortic ectasia: Secondary | ICD-10-CM | POA: Diagnosis not present

## 2019-01-14 DIAGNOSIS — I10 Essential (primary) hypertension: Secondary | ICD-10-CM | POA: Diagnosis not present

## 2019-01-14 DIAGNOSIS — I2510 Atherosclerotic heart disease of native coronary artery without angina pectoris: Secondary | ICD-10-CM | POA: Diagnosis not present

## 2019-01-14 DIAGNOSIS — N400 Enlarged prostate without lower urinary tract symptoms: Secondary | ICD-10-CM | POA: Diagnosis not present

## 2019-01-14 DIAGNOSIS — Z6825 Body mass index (BMI) 25.0-25.9, adult: Secondary | ICD-10-CM | POA: Diagnosis not present

## 2019-01-14 DIAGNOSIS — Z8546 Personal history of malignant neoplasm of prostate: Secondary | ICD-10-CM | POA: Diagnosis not present

## 2019-01-14 DIAGNOSIS — F17210 Nicotine dependence, cigarettes, uncomplicated: Secondary | ICD-10-CM | POA: Diagnosis not present

## 2019-04-29 DIAGNOSIS — E782 Mixed hyperlipidemia: Secondary | ICD-10-CM | POA: Diagnosis not present

## 2019-04-29 DIAGNOSIS — K219 Gastro-esophageal reflux disease without esophagitis: Secondary | ICD-10-CM | POA: Diagnosis not present

## 2019-04-29 DIAGNOSIS — I10 Essential (primary) hypertension: Secondary | ICD-10-CM | POA: Diagnosis not present

## 2019-05-01 DIAGNOSIS — F17210 Nicotine dependence, cigarettes, uncomplicated: Secondary | ICD-10-CM | POA: Diagnosis not present

## 2019-05-01 DIAGNOSIS — I700 Atherosclerosis of aorta: Secondary | ICD-10-CM | POA: Diagnosis not present

## 2019-05-01 DIAGNOSIS — Z8547 Personal history of malignant neoplasm of testis: Secondary | ICD-10-CM | POA: Diagnosis not present

## 2019-05-01 DIAGNOSIS — I10 Essential (primary) hypertension: Secondary | ICD-10-CM | POA: Diagnosis not present

## 2019-05-01 DIAGNOSIS — E782 Mixed hyperlipidemia: Secondary | ICD-10-CM | POA: Diagnosis not present

## 2019-05-01 DIAGNOSIS — Z8546 Personal history of malignant neoplasm of prostate: Secondary | ICD-10-CM | POA: Diagnosis not present

## 2019-05-01 DIAGNOSIS — I2510 Atherosclerotic heart disease of native coronary artery without angina pectoris: Secondary | ICD-10-CM | POA: Diagnosis not present

## 2019-05-01 DIAGNOSIS — I77810 Thoracic aortic ectasia: Secondary | ICD-10-CM | POA: Diagnosis not present

## 2019-05-01 DIAGNOSIS — N400 Enlarged prostate without lower urinary tract symptoms: Secondary | ICD-10-CM | POA: Diagnosis not present

## 2019-06-11 ENCOUNTER — Telehealth: Payer: Medicare HMO | Admitting: Cardiovascular Disease

## 2019-07-07 ENCOUNTER — Telehealth: Payer: Medicare HMO | Admitting: Cardiovascular Disease

## 2019-07-17 DIAGNOSIS — I10 Essential (primary) hypertension: Secondary | ICD-10-CM | POA: Diagnosis not present

## 2019-07-17 DIAGNOSIS — E782 Mixed hyperlipidemia: Secondary | ICD-10-CM | POA: Diagnosis not present

## 2019-07-30 ENCOUNTER — Encounter: Payer: Self-pay | Admitting: Cardiovascular Disease

## 2019-07-30 ENCOUNTER — Telehealth (INDEPENDENT_AMBULATORY_CARE_PROVIDER_SITE_OTHER): Payer: Medicare HMO | Admitting: Cardiovascular Disease

## 2019-07-30 VITALS — BP 168/110 | HR 80 | Ht 66.0 in | Wt 163.0 lb

## 2019-07-30 DIAGNOSIS — R9439 Abnormal result of other cardiovascular function study: Secondary | ICD-10-CM | POA: Diagnosis not present

## 2019-07-30 DIAGNOSIS — I10 Essential (primary) hypertension: Secondary | ICD-10-CM

## 2019-07-30 NOTE — Progress Notes (Signed)
Virtual Visit via Telephone Note   This visit type was conducted due to national recommendations for restrictions regarding the COVID-19 Pandemic (e.g. social distancing) in an effort to limit this patient's exposure and mitigate transmission in our community.  Due to his co-morbid illnesses, this patient is at least at moderate risk for complications without adequate follow up.  This format is felt to be most appropriate for this patient at this time.  The patient did not have access to video technology/had technical difficulties with video requiring transitioning to audio format only (telephone).  All issues noted in this document were discussed and addressed.  No physical exam could be performed with this format.  Please refer to the patient's chart for his  consent to telehealth for [facility].   Date:  07/30/2019   ID:  Timothy Nguyen, DOB 04/13/1951, MRN [id number]  Patient Location: Home Provider Location: Office  PCP:  Curlene Labrum, MD  Cardiologist:  Kate Sable, MD  Electrophysiologist:  None   Evaluation Performed:  Follow-Up Visit  Chief Complaint:  Abnormal stress test  History of Present Illness:    Timothy Nguyen is a 68 y.o. male with presumed coronary artery disease based on an abnormal stress test.  Nuclear stress test performed on 03/21/17 demonstrated a small fixed defect in the inferior wall consistent with prior infarct with no reversible ischemia.  Reportedly, there was hypokinesis of the inferior wall, LVEF 44%.  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  He started back taking an additional antihypertensive medication yesterday but he does not know the name of it.  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  The patient denies any symptoms of chest pain, palpitations, shortness of breath, lightheadedness, dizziness, leg swelling, orthopnea, PND, and syncope.  He goes to the gym twice a day and has been training  in both boxing and jujitsu.  He walks 3-4 miles daily at work without difficulty.  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  He is thinking about boxing a Orthoptist in [place].  Soc Hx: He was scheduled to fight [name] in [place] back in the 1970s but his wife prevented him from doing so. Heboxedin the light middleweight division. He used to box in [place], [place], and [place]. He boxed in 17 fights.  Past Medical History:  Diagnosis Date  . Arthritis   . Cancer [facility])    prostate, testicular  . GERD (gastroesophageal reflux disease)   . Hyperlipemia   . Hypertension    Past Surgical History:  Procedure Laterality Date  . BACK SURGERY    . COLONOSCOPY  01/21/2012   Procedure: COLONOSCOPY;  Surgeon: Danie Binder, MD;  [facility];  Service: Endoscopy;  Laterality: N/A;  11:15 AM  . ORCHIECTOMY    . PROSTATECTOMY       Current Meds  Medication Sig  . famotidine (PEPCID AC MAXIMUM STRENGTH) 20 MG tablet Take 20 mg by mouth 2 (two) times daily.  Marland Kitchen lisinopril (PRINIVIL,ZESTRIL) 20 MG tablet Take 20 mg by mouth 2 (two) times daily.   . Omega-3 Fatty Acids (OMEGA 3 500 PO) Take by mouth daily.   . rosuvastatin (CRESTOR) 5 MG tablet Take 1 tablet (5 mg total) by mouth daily.     Allergies:   Patient has no known allergies.   Social History   Tobacco Use  . Smoking status: Current Some  Day Smoker    Packs/day: 0.20  . Smokeless tobacco: Never Used  . Tobacco comment: just smokes when he drinks not often   Substance Use Topics  . Alcohol use: Yes    Comment: occasional  . Drug use: No     Family Hx: The patient's family history includes Alzheimer's disease in his mother and paternal grandmother; Congestive Heart Failure in his father; Heart attack in his father; Prostate cancer in his brother.  ROS:   Please see the history of present illness.     All other systems reviewed and are negative.   Prior CV studies:   The following  studies were reviewed today:  Reviewed above  Labs/Other Tests and Data Reviewed:    EKG:  No ECG reviewed.  Recent Labs: No results found for requested labs within last 8760 hours.   Recent Lipid Panel No results found for: CHOL, TRIG, HDL, CHOLHDL, LDLCALC, LDLDIRECT  Wt Readings from Last 3 Encounters:  07/30/19 163 lb (73.9 kg)  06/18/18 155 lb 3.2 oz (70.4 kg)  05/31/18 160 lb (72.6 kg)     Objective:    Vital Signs:  BP (!) 168/110 Comment: been taking some cold medicine  Pulse 80   Ht 5\' 6"  (1.676 m)   Wt 163 lb (73.9 kg)   BMI 26.31 kg/m    VITAL SIGNS:  reviewed  ASSESSMENT & PLAN:    1. Abnormal nuclear stress test/presumed CAD: Symptomatically stable.  Continue Crestor. He stopped ASA because he ran out. I encouraged him to take ASA 81 mg daily.  2. Hypertension: Blood pressure is markedly elevated. He started another antihypertensive medication yesterday but does not know the name of it.  I asked him to call us back with the name of it.  I also encouraged him to reduce alcohol consumption as he drinks between 1/2 pint and a full pint of liquor daily.     COVID-19 Education: The signs and symptoms of COVID-19 were discussed with the patient and how to seek care for testing (follow up with PCP or arrange E-visit).  The importance of social distancing was discussed today.  Time:   Today, I have spent 10 minutes with the patient with telehealth technology discussing the above problems.     Medication Adjustments/Labs and Tests Ordered: Current medicines are reviewed at length with the patient today.  Concerns regarding medicines are outlined above.   Tests Ordered: No orders of the defined types were placed in this encounter.   Medication Changes: No orders of the defined types were placed in this encounter.   Follow Up:  Either In Person or Virtual in 1 year(s)  Signed, Kate Sable, MD  07/30/2019 9:38 AM    [facility]

## 2019-07-30 NOTE — Patient Instructions (Signed)
Medication Instructions:   Continue all current medications.  Please call the office back with name of the new blood pressure pill.    Labwork: none  Testing/Procedures: none  Follow-Up: Your physician wants you to follow up in:  1 year.  You will receive a reminder letter in the mail one-two months in advance.  If you don't receive a letter, please call our office to schedule the follow up appointment   Any Other Special Instructions Will Be Listed Below (If Applicable).  If you need a refill on your cardiac medications before your next appointment, please call your pharmacy.

## 2019-08-17 DIAGNOSIS — I10 Essential (primary) hypertension: Secondary | ICD-10-CM | POA: Diagnosis not present

## 2019-08-17 DIAGNOSIS — E782 Mixed hyperlipidemia: Secondary | ICD-10-CM | POA: Diagnosis not present

## 2019-09-17 DIAGNOSIS — E782 Mixed hyperlipidemia: Secondary | ICD-10-CM | POA: Diagnosis not present

## 2019-09-17 DIAGNOSIS — I10 Essential (primary) hypertension: Secondary | ICD-10-CM | POA: Diagnosis not present

## 2019-11-02 DIAGNOSIS — Z0001 Encounter for general adult medical examination with abnormal findings: Secondary | ICD-10-CM | POA: Diagnosis not present

## 2019-11-12 DIAGNOSIS — I700 Atherosclerosis of aorta: Secondary | ICD-10-CM | POA: Diagnosis not present

## 2019-11-12 DIAGNOSIS — I10 Essential (primary) hypertension: Secondary | ICD-10-CM | POA: Diagnosis not present

## 2019-11-12 DIAGNOSIS — R944 Abnormal results of kidney function studies: Secondary | ICD-10-CM | POA: Diagnosis not present

## 2019-11-12 DIAGNOSIS — Z8547 Personal history of malignant neoplasm of testis: Secondary | ICD-10-CM | POA: Diagnosis not present

## 2019-11-12 DIAGNOSIS — Z0001 Encounter for general adult medical examination with abnormal findings: Secondary | ICD-10-CM | POA: Diagnosis not present

## 2019-11-12 DIAGNOSIS — F17210 Nicotine dependence, cigarettes, uncomplicated: Secondary | ICD-10-CM | POA: Diagnosis not present

## 2019-11-12 DIAGNOSIS — Z6826 Body mass index (BMI) 26.0-26.9, adult: Secondary | ICD-10-CM | POA: Diagnosis not present

## 2019-11-12 DIAGNOSIS — S46011A Strain of muscle(s) and tendon(s) of the rotator cuff of right shoulder, initial encounter: Secondary | ICD-10-CM | POA: Diagnosis not present

## 2019-11-12 DIAGNOSIS — I77810 Thoracic aortic ectasia: Secondary | ICD-10-CM | POA: Diagnosis not present

## 2019-11-13 DIAGNOSIS — I10 Essential (primary) hypertension: Secondary | ICD-10-CM | POA: Diagnosis not present

## 2019-11-13 DIAGNOSIS — E7849 Other hyperlipidemia: Secondary | ICD-10-CM | POA: Diagnosis not present

## 2019-11-17 DIAGNOSIS — I712 Thoracic aortic aneurysm, without rupture: Secondary | ICD-10-CM | POA: Diagnosis not present

## 2019-11-17 DIAGNOSIS — I10 Essential (primary) hypertension: Secondary | ICD-10-CM | POA: Diagnosis not present

## 2019-11-17 DIAGNOSIS — I77810 Thoracic aortic ectasia: Secondary | ICD-10-CM | POA: Diagnosis not present

## 2019-11-17 DIAGNOSIS — I700 Atherosclerosis of aorta: Secondary | ICD-10-CM | POA: Diagnosis not present

## 2019-12-07 DIAGNOSIS — I2510 Atherosclerotic heart disease of native coronary artery without angina pectoris: Secondary | ICD-10-CM | POA: Diagnosis not present

## 2019-12-07 DIAGNOSIS — I700 Atherosclerosis of aorta: Secondary | ICD-10-CM | POA: Diagnosis not present

## 2019-12-07 DIAGNOSIS — I10 Essential (primary) hypertension: Secondary | ICD-10-CM | POA: Diagnosis not present

## 2019-12-07 DIAGNOSIS — M19011 Primary osteoarthritis, right shoulder: Secondary | ICD-10-CM | POA: Diagnosis not present

## 2019-12-07 DIAGNOSIS — Z6826 Body mass index (BMI) 26.0-26.9, adult: Secondary | ICD-10-CM | POA: Diagnosis not present

## 2019-12-07 DIAGNOSIS — S46011A Strain of muscle(s) and tendon(s) of the rotator cuff of right shoulder, initial encounter: Secondary | ICD-10-CM | POA: Diagnosis not present

## 2019-12-16 DIAGNOSIS — I10 Essential (primary) hypertension: Secondary | ICD-10-CM | POA: Diagnosis not present

## 2019-12-16 DIAGNOSIS — E7849 Other hyperlipidemia: Secondary | ICD-10-CM | POA: Diagnosis not present

## 2020-01-08 DIAGNOSIS — H8111 Benign paroxysmal vertigo, right ear: Secondary | ICD-10-CM | POA: Diagnosis not present

## 2020-01-08 DIAGNOSIS — Z6826 Body mass index (BMI) 26.0-26.9, adult: Secondary | ICD-10-CM | POA: Diagnosis not present

## 2020-01-15 DIAGNOSIS — E7849 Other hyperlipidemia: Secondary | ICD-10-CM | POA: Diagnosis not present

## 2020-01-15 DIAGNOSIS — I10 Essential (primary) hypertension: Secondary | ICD-10-CM | POA: Diagnosis not present

## 2020-02-08 DIAGNOSIS — F1010 Alcohol abuse, uncomplicated: Secondary | ICD-10-CM | POA: Diagnosis not present

## 2020-02-08 DIAGNOSIS — R944 Abnormal results of kidney function studies: Secondary | ICD-10-CM | POA: Diagnosis not present

## 2020-02-08 DIAGNOSIS — Z6825 Body mass index (BMI) 25.0-25.9, adult: Secondary | ICD-10-CM | POA: Diagnosis not present

## 2020-02-08 DIAGNOSIS — I2510 Atherosclerotic heart disease of native coronary artery without angina pectoris: Secondary | ICD-10-CM | POA: Diagnosis not present

## 2020-02-08 DIAGNOSIS — I700 Atherosclerosis of aorta: Secondary | ICD-10-CM | POA: Diagnosis not present

## 2020-02-08 DIAGNOSIS — I10 Essential (primary) hypertension: Secondary | ICD-10-CM | POA: Diagnosis not present

## 2020-02-08 DIAGNOSIS — M19011 Primary osteoarthritis, right shoulder: Secondary | ICD-10-CM | POA: Diagnosis not present

## 2020-02-08 DIAGNOSIS — I77810 Thoracic aortic ectasia: Secondary | ICD-10-CM | POA: Diagnosis not present

## 2020-02-15 DIAGNOSIS — I10 Essential (primary) hypertension: Secondary | ICD-10-CM | POA: Diagnosis not present

## 2020-02-15 DIAGNOSIS — I2510 Atherosclerotic heart disease of native coronary artery without angina pectoris: Secondary | ICD-10-CM | POA: Diagnosis not present

## 2020-02-15 DIAGNOSIS — Z720 Tobacco use: Secondary | ICD-10-CM | POA: Diagnosis not present

## 2020-02-15 DIAGNOSIS — E7849 Other hyperlipidemia: Secondary | ICD-10-CM | POA: Diagnosis not present

## 2020-03-16 DIAGNOSIS — I10 Essential (primary) hypertension: Secondary | ICD-10-CM | POA: Diagnosis not present

## 2020-03-16 DIAGNOSIS — E7849 Other hyperlipidemia: Secondary | ICD-10-CM | POA: Diagnosis not present

## 2020-03-16 DIAGNOSIS — I2510 Atherosclerotic heart disease of native coronary artery without angina pectoris: Secondary | ICD-10-CM | POA: Diagnosis not present

## 2020-03-16 DIAGNOSIS — Z720 Tobacco use: Secondary | ICD-10-CM | POA: Diagnosis not present

## 2020-04-15 DIAGNOSIS — I10 Essential (primary) hypertension: Secondary | ICD-10-CM | POA: Diagnosis not present

## 2020-04-15 DIAGNOSIS — I2510 Atherosclerotic heart disease of native coronary artery without angina pectoris: Secondary | ICD-10-CM | POA: Diagnosis not present

## 2020-04-15 DIAGNOSIS — Z720 Tobacco use: Secondary | ICD-10-CM | POA: Diagnosis not present

## 2020-04-15 DIAGNOSIS — E7849 Other hyperlipidemia: Secondary | ICD-10-CM | POA: Diagnosis not present

## 2020-05-17 DIAGNOSIS — I2510 Atherosclerotic heart disease of native coronary artery without angina pectoris: Secondary | ICD-10-CM | POA: Diagnosis not present

## 2020-05-17 DIAGNOSIS — E7849 Other hyperlipidemia: Secondary | ICD-10-CM | POA: Diagnosis not present

## 2020-05-17 DIAGNOSIS — I10 Essential (primary) hypertension: Secondary | ICD-10-CM | POA: Diagnosis not present

## 2020-05-17 DIAGNOSIS — Z720 Tobacco use: Secondary | ICD-10-CM | POA: Diagnosis not present

## 2020-05-31 DIAGNOSIS — F17210 Nicotine dependence, cigarettes, uncomplicated: Secondary | ICD-10-CM | POA: Diagnosis not present

## 2020-05-31 DIAGNOSIS — K219 Gastro-esophageal reflux disease without esophagitis: Secondary | ICD-10-CM | POA: Diagnosis not present

## 2020-05-31 DIAGNOSIS — E782 Mixed hyperlipidemia: Secondary | ICD-10-CM | POA: Diagnosis not present

## 2020-05-31 DIAGNOSIS — I10 Essential (primary) hypertension: Secondary | ICD-10-CM | POA: Diagnosis not present

## 2020-06-02 DIAGNOSIS — M19011 Primary osteoarthritis, right shoulder: Secondary | ICD-10-CM | POA: Diagnosis not present

## 2020-06-02 DIAGNOSIS — I77810 Thoracic aortic ectasia: Secondary | ICD-10-CM | POA: Diagnosis not present

## 2020-06-02 DIAGNOSIS — Z6826 Body mass index (BMI) 26.0-26.9, adult: Secondary | ICD-10-CM | POA: Diagnosis not present

## 2020-06-02 DIAGNOSIS — I10 Essential (primary) hypertension: Secondary | ICD-10-CM | POA: Diagnosis not present

## 2020-06-02 DIAGNOSIS — Z23 Encounter for immunization: Secondary | ICD-10-CM | POA: Diagnosis not present

## 2020-06-02 DIAGNOSIS — I2510 Atherosclerotic heart disease of native coronary artery without angina pectoris: Secondary | ICD-10-CM | POA: Diagnosis not present

## 2020-06-02 DIAGNOSIS — N183 Chronic kidney disease, stage 3 unspecified: Secondary | ICD-10-CM | POA: Diagnosis not present

## 2020-07-16 DIAGNOSIS — I10 Essential (primary) hypertension: Secondary | ICD-10-CM | POA: Diagnosis not present

## 2020-07-16 DIAGNOSIS — E7849 Other hyperlipidemia: Secondary | ICD-10-CM | POA: Diagnosis not present

## 2020-07-16 DIAGNOSIS — I2510 Atherosclerotic heart disease of native coronary artery without angina pectoris: Secondary | ICD-10-CM | POA: Diagnosis not present

## 2020-07-16 DIAGNOSIS — Z720 Tobacco use: Secondary | ICD-10-CM | POA: Diagnosis not present

## 2020-07-31 NOTE — Progress Notes (Signed)
Cardiology Office Note  Date: 08/01/2020   ID: Timothy Nguyen, DOB 12/28/1950, MRN [id number]  PCP:  Curlene Labrum, MD  Cardiologist:  No primary care provider on file. Electrophysiologist:  None   Chief Complaint: Follow-up hypertension, abnormal stress test.  History of Present Illness: Timothy Nguyen is a 69 y.o. male with a history of hypertension, abnormal stress test, GERD, HLD, esophageal dysphagia.  Last encounter 07/30/2019 via telemedicine visit with Dr. Bronson Ing.  History of presumed CAD based on abnormal stress test.  Stress test 2018 demonstrated a small fixed defect in the inferior wall consistent with prior infarct with no reversible ischemia.  LVEF was 44%.  Patient had been drinking between 1/2pint to 1 pint of blue leg liquor daily.  He had started back taking his antihypertensive medication but did not know the name of the medication.  He was symptomatically stable.  Continuing Crestor.  Stopped aspirin due to running out of the medication.  He was encouraged to restart aspirin 81 mg daily.  Blood pressure was markedly elevated but he had recently started another antihypertensive medication the prior day.  He did not know the name of the medication.  He was encouraged to reduce alcohol consumption.  He is here for 1 year follow-up.  He denies any recent acute illnesses or hospitalizations.  States he is having a fair amount sinus issues and had an episode of dizziness this morning but otherwise has had no other issues with lightheadedness dizziness, presyncopal or syncopal episode.  Denies any anginal or exertional symptoms, palpitations or arrhythmias, orthostatic symptoms, stroke or TIA-like symptoms, PND, orthopnea, bleeding issues, claudication-like symptoms, DVT or PE-like symptoms.  States she does have some arthritis in his neck and shoulder as well as some arthritic hip pain at times.  States he stays very active and works out including walking, boxing, and  other activities.  Past Medical History:  Diagnosis Date  . Arthritis   . Cancer [facility])    prostate, testicular  . GERD (gastroesophageal reflux disease)   . Hyperlipemia   . Hypertension     Past Surgical History:  Procedure Laterality Date  . BACK SURGERY    . COLONOSCOPY  01/21/2012   Procedure: COLONOSCOPY;  Surgeon: Danie Binder, MD;  [facility];  Service: Endoscopy;  Laterality: N/A;  11:15 AM  . ORCHIECTOMY    . PROSTATECTOMY      Current Outpatient Medications  Medication Sig Dispense Refill  . famotidine (PEPCID AC MAXIMUM STRENGTH) 20 MG tablet Take 20 mg by mouth 2 (two) times daily.    Marland Kitchen lisinopril (PRINIVIL,ZESTRIL) 20 MG tablet Take 20 mg by mouth 2 (two) times daily.     . Omega-3 Fatty Acids (OMEGA 3 500 PO) Take by mouth daily.     . rosuvastatin (CRESTOR) 5 MG tablet Take 1 tablet (5 mg total) by mouth daily. 30 tablet 6   No current facility-administered medications for this visit.   Allergies:  Patient has no known allergies.   Social History: The patient  reports that he has been smoking. He has been smoking about 0.20 packs per day. He has never used smokeless tobacco. He reports current alcohol use. He reports that he does not use drugs.   Family History: The patient's family history includes Alzheimer's disease in his mother and paternal grandmother; Congestive Heart Failure in his father; Heart attack in his father; Prostate cancer in his brother.   ROS:  Please see the history  of present illness. Otherwise, complete review of systems is positive for none.  All other systems are reviewed and negative.   Physical Exam: VS:  BP 128/86   Pulse 84   Ht 5\' 6"  (1.676 m)   Wt 158 lb 12.8 oz (72 kg)   SpO2 97%   BMI 25.63 kg/m , BMI Body mass index is 25.63 kg/m.  Wt Readings from Last 3 Encounters:  08/01/20 158 lb 12.8 oz (72 kg)  07/30/19 163 lb (73.9 kg)  06/18/18 155 lb 3.2 oz (70.4 kg)    General: Patient appears comfortable at  rest. Neck: Supple, no elevated JVP or carotid bruits, no thyromegaly. Lungs: Clear to auscultation, nonlabored breathing at rest. Cardiac: Regular rate and rhythm, no S3 or significant systolic murmur, no pericardial rub. Extremities: No pitting edema, distal pulses 2+. Skin: Warm and dry. Musculoskeletal: No kyphosis. Neuropsychiatric: Alert and oriented x3, affect grossly appropriate.  ECG:  An ECG dated 08/01/2020 was personally reviewed today and demonstrated:  Normal sinus rhythm rate of 82, septal infarct age undetermined, inferior infarct age undetermined.  Recent Labwork: No results found for requested labs within last 8760 hours.  No results found for: CHOL, TRIG, HDL, CHOLHDL, VLDL, LDLCALC, LDLDIRECT  Other Studies Reviewed Today:   Assessment and Plan:  1. Abnormal stress test   2. Essential hypertension   3. Mixed hyperlipidemia    1. Abnormal stress test Previous abnormal stress test.  Patient denies any anginal or exertional symptoms.  Previous CT scan and September 2019 demonstrated evidence of aortic atherosclerosis and atherosclerotic CAD.  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    2. Essential hypertension Blood pressures reasonably well controlled.  Blood pressure today 128/86.  Continue lisinopril 20 mg daily.  3.  Hyperlipidemia Continue Crestor 5 mg daily.  Continue omega-3 fatty acid daily.  Please obtain most recent lab work from PCP.   Medication Adjustments/Labs and Tests Ordered: Current medicines are reviewed at length with the patient today.  Concerns regarding medicines are outlined above.   Disposition: Follow-up with Dr. Harl Bowie or APP 1 year.  Signed, Levell July, NP 08/01/2020 2:14 PM    [facility] at [place], [place], [place] [address] Phone: [phone]; Fax: [phone]

## 2020-08-01 ENCOUNTER — Encounter: Payer: Self-pay | Admitting: Family Medicine

## 2020-08-01 ENCOUNTER — Encounter: Payer: Self-pay | Admitting: *Deleted

## 2020-08-01 ENCOUNTER — Ambulatory Visit: Payer: Medicare HMO | Admitting: Family Medicine

## 2020-08-01 VITALS — BP 128/86 | HR 84 | Ht 66.0 in | Wt 158.8 lb

## 2020-08-01 DIAGNOSIS — R9439 Abnormal result of other cardiovascular function study: Secondary | ICD-10-CM

## 2020-08-01 DIAGNOSIS — I10 Essential (primary) hypertension: Secondary | ICD-10-CM

## 2020-08-01 DIAGNOSIS — E785 Hyperlipidemia, unspecified: Secondary | ICD-10-CM | POA: Insufficient documentation

## 2020-08-01 DIAGNOSIS — E782 Mixed hyperlipidemia: Secondary | ICD-10-CM | POA: Diagnosis not present

## 2020-08-01 NOTE — Patient Instructions (Addendum)

## 2020-08-16 DIAGNOSIS — I2510 Atherosclerotic heart disease of native coronary artery without angina pectoris: Secondary | ICD-10-CM | POA: Diagnosis not present

## 2020-08-16 DIAGNOSIS — I10 Essential (primary) hypertension: Secondary | ICD-10-CM | POA: Diagnosis not present

## 2020-08-16 DIAGNOSIS — Z720 Tobacco use: Secondary | ICD-10-CM | POA: Diagnosis not present

## 2020-08-16 DIAGNOSIS — E7849 Other hyperlipidemia: Secondary | ICD-10-CM | POA: Diagnosis not present

## 2020-08-30 DIAGNOSIS — K219 Gastro-esophageal reflux disease without esophagitis: Secondary | ICD-10-CM | POA: Diagnosis not present

## 2020-08-30 DIAGNOSIS — E782 Mixed hyperlipidemia: Secondary | ICD-10-CM | POA: Diagnosis not present

## 2020-08-30 DIAGNOSIS — I10 Essential (primary) hypertension: Secondary | ICD-10-CM | POA: Diagnosis not present

## 2020-08-30 DIAGNOSIS — N183 Chronic kidney disease, stage 3 unspecified: Secondary | ICD-10-CM | POA: Diagnosis not present

## 2020-08-30 DIAGNOSIS — N400 Enlarged prostate without lower urinary tract symptoms: Secondary | ICD-10-CM | POA: Diagnosis not present

## 2020-09-01 DIAGNOSIS — I700 Atherosclerosis of aorta: Secondary | ICD-10-CM | POA: Diagnosis not present

## 2020-09-01 DIAGNOSIS — F1010 Alcohol abuse, uncomplicated: Secondary | ICD-10-CM | POA: Diagnosis not present

## 2020-09-01 DIAGNOSIS — I10 Essential (primary) hypertension: Secondary | ICD-10-CM | POA: Diagnosis not present

## 2020-09-01 DIAGNOSIS — Z6826 Body mass index (BMI) 26.0-26.9, adult: Secondary | ICD-10-CM | POA: Diagnosis not present

## 2020-09-01 DIAGNOSIS — N183 Chronic kidney disease, stage 3 unspecified: Secondary | ICD-10-CM | POA: Diagnosis not present

## 2020-09-01 DIAGNOSIS — E875 Hyperkalemia: Secondary | ICD-10-CM | POA: Diagnosis not present

## 2020-09-01 DIAGNOSIS — I2510 Atherosclerotic heart disease of native coronary artery without angina pectoris: Secondary | ICD-10-CM | POA: Diagnosis not present

## 2020-09-08 DIAGNOSIS — Z6826 Body mass index (BMI) 26.0-26.9, adult: Secondary | ICD-10-CM | POA: Diagnosis not present

## 2020-09-08 DIAGNOSIS — T148XXA Other injury of unspecified body region, initial encounter: Secondary | ICD-10-CM | POA: Diagnosis not present

## 2020-10-15 DIAGNOSIS — Z720 Tobacco use: Secondary | ICD-10-CM | POA: Diagnosis not present

## 2020-10-15 DIAGNOSIS — E7849 Other hyperlipidemia: Secondary | ICD-10-CM | POA: Diagnosis not present

## 2020-10-15 DIAGNOSIS — I10 Essential (primary) hypertension: Secondary | ICD-10-CM | POA: Diagnosis not present

## 2020-10-15 DIAGNOSIS — I2510 Atherosclerotic heart disease of native coronary artery without angina pectoris: Secondary | ICD-10-CM | POA: Diagnosis not present

## 2020-10-19 DIAGNOSIS — Z20828 Contact with and (suspected) exposure to other viral communicable diseases: Secondary | ICD-10-CM | POA: Diagnosis not present

## 2020-10-19 DIAGNOSIS — J019 Acute sinusitis, unspecified: Secondary | ICD-10-CM | POA: Diagnosis not present

## 2020-11-14 DIAGNOSIS — I10 Essential (primary) hypertension: Secondary | ICD-10-CM | POA: Diagnosis not present

## 2020-11-14 DIAGNOSIS — E7849 Other hyperlipidemia: Secondary | ICD-10-CM | POA: Diagnosis not present

## 2020-11-14 DIAGNOSIS — Z720 Tobacco use: Secondary | ICD-10-CM | POA: Diagnosis not present

## 2020-11-14 DIAGNOSIS — I2510 Atherosclerotic heart disease of native coronary artery without angina pectoris: Secondary | ICD-10-CM | POA: Diagnosis not present

## 2020-12-14 DIAGNOSIS — I2510 Atherosclerotic heart disease of native coronary artery without angina pectoris: Secondary | ICD-10-CM | POA: Diagnosis not present

## 2020-12-14 DIAGNOSIS — I10 Essential (primary) hypertension: Secondary | ICD-10-CM | POA: Diagnosis not present

## 2020-12-14 DIAGNOSIS — Z720 Tobacco use: Secondary | ICD-10-CM | POA: Diagnosis not present

## 2020-12-14 DIAGNOSIS — E7849 Other hyperlipidemia: Secondary | ICD-10-CM | POA: Diagnosis not present

## 2021-01-11 DIAGNOSIS — Z6825 Body mass index (BMI) 25.0-25.9, adult: Secondary | ICD-10-CM | POA: Diagnosis not present

## 2021-01-11 DIAGNOSIS — L918 Other hypertrophic disorders of the skin: Secondary | ICD-10-CM | POA: Diagnosis not present

## 2021-01-11 DIAGNOSIS — L821 Other seborrheic keratosis: Secondary | ICD-10-CM | POA: Diagnosis not present

## 2021-01-14 DIAGNOSIS — E7849 Other hyperlipidemia: Secondary | ICD-10-CM | POA: Diagnosis not present

## 2021-01-14 DIAGNOSIS — I10 Essential (primary) hypertension: Secondary | ICD-10-CM | POA: Diagnosis not present

## 2021-01-14 DIAGNOSIS — Z720 Tobacco use: Secondary | ICD-10-CM | POA: Diagnosis not present

## 2021-01-14 DIAGNOSIS — I2510 Atherosclerotic heart disease of native coronary artery without angina pectoris: Secondary | ICD-10-CM | POA: Diagnosis not present

## 2021-02-13 DIAGNOSIS — I2510 Atherosclerotic heart disease of native coronary artery without angina pectoris: Secondary | ICD-10-CM | POA: Diagnosis not present

## 2021-02-13 DIAGNOSIS — E7849 Other hyperlipidemia: Secondary | ICD-10-CM | POA: Diagnosis not present

## 2021-02-13 DIAGNOSIS — Z720 Tobacco use: Secondary | ICD-10-CM | POA: Diagnosis not present

## 2021-02-13 DIAGNOSIS — I10 Essential (primary) hypertension: Secondary | ICD-10-CM | POA: Diagnosis not present

## 2021-02-14 DIAGNOSIS — L570 Actinic keratosis: Secondary | ICD-10-CM | POA: Diagnosis not present

## 2021-02-24 DIAGNOSIS — Z125 Encounter for screening for malignant neoplasm of prostate: Secondary | ICD-10-CM | POA: Diagnosis not present

## 2021-02-24 DIAGNOSIS — E7849 Other hyperlipidemia: Secondary | ICD-10-CM | POA: Diagnosis not present

## 2021-02-24 DIAGNOSIS — E875 Hyperkalemia: Secondary | ICD-10-CM | POA: Diagnosis not present

## 2021-02-24 DIAGNOSIS — E782 Mixed hyperlipidemia: Secondary | ICD-10-CM | POA: Diagnosis not present

## 2021-02-24 DIAGNOSIS — N183 Chronic kidney disease, stage 3 unspecified: Secondary | ICD-10-CM | POA: Diagnosis not present

## 2021-02-24 DIAGNOSIS — I10 Essential (primary) hypertension: Secondary | ICD-10-CM | POA: Diagnosis not present

## 2021-02-24 DIAGNOSIS — K219 Gastro-esophageal reflux disease without esophagitis: Secondary | ICD-10-CM | POA: Diagnosis not present

## 2021-03-01 DIAGNOSIS — F1010 Alcohol abuse, uncomplicated: Secondary | ICD-10-CM | POA: Diagnosis not present

## 2021-03-01 DIAGNOSIS — E875 Hyperkalemia: Secondary | ICD-10-CM | POA: Diagnosis not present

## 2021-03-01 DIAGNOSIS — I2510 Atherosclerotic heart disease of native coronary artery without angina pectoris: Secondary | ICD-10-CM | POA: Diagnosis not present

## 2021-03-01 DIAGNOSIS — N183 Chronic kidney disease, stage 3 unspecified: Secondary | ICD-10-CM | POA: Diagnosis not present

## 2021-03-01 DIAGNOSIS — I77810 Thoracic aortic ectasia: Secondary | ICD-10-CM | POA: Diagnosis not present

## 2021-03-01 DIAGNOSIS — I700 Atherosclerosis of aorta: Secondary | ICD-10-CM | POA: Diagnosis not present

## 2021-03-01 DIAGNOSIS — F17210 Nicotine dependence, cigarettes, uncomplicated: Secondary | ICD-10-CM | POA: Diagnosis not present

## 2021-03-01 DIAGNOSIS — I10 Essential (primary) hypertension: Secondary | ICD-10-CM | POA: Diagnosis not present

## 2021-03-01 DIAGNOSIS — Z0001 Encounter for general adult medical examination with abnormal findings: Secondary | ICD-10-CM | POA: Diagnosis not present

## 2021-03-09 DIAGNOSIS — Z9889 Other specified postprocedural states: Secondary | ICD-10-CM | POA: Diagnosis not present

## 2021-03-09 DIAGNOSIS — I77810 Thoracic aortic ectasia: Secondary | ICD-10-CM | POA: Diagnosis not present

## 2021-03-09 DIAGNOSIS — I2510 Atherosclerotic heart disease of native coronary artery without angina pectoris: Secondary | ICD-10-CM | POA: Diagnosis not present

## 2021-03-09 DIAGNOSIS — I700 Atherosclerosis of aorta: Secondary | ICD-10-CM | POA: Diagnosis not present

## 2021-03-16 DIAGNOSIS — Z720 Tobacco use: Secondary | ICD-10-CM | POA: Diagnosis not present

## 2021-03-16 DIAGNOSIS — I2510 Atherosclerotic heart disease of native coronary artery without angina pectoris: Secondary | ICD-10-CM | POA: Diagnosis not present

## 2021-03-16 DIAGNOSIS — E7849 Other hyperlipidemia: Secondary | ICD-10-CM | POA: Diagnosis not present

## 2021-03-16 DIAGNOSIS — I10 Essential (primary) hypertension: Secondary | ICD-10-CM | POA: Diagnosis not present

## 2021-04-16 DIAGNOSIS — I2510 Atherosclerotic heart disease of native coronary artery without angina pectoris: Secondary | ICD-10-CM | POA: Diagnosis not present

## 2021-04-16 DIAGNOSIS — Z720 Tobacco use: Secondary | ICD-10-CM | POA: Diagnosis not present

## 2021-04-16 DIAGNOSIS — I10 Essential (primary) hypertension: Secondary | ICD-10-CM | POA: Diagnosis not present

## 2021-04-16 DIAGNOSIS — E7849 Other hyperlipidemia: Secondary | ICD-10-CM | POA: Diagnosis not present

## 2021-05-17 DIAGNOSIS — Z720 Tobacco use: Secondary | ICD-10-CM | POA: Diagnosis not present

## 2021-05-17 DIAGNOSIS — I10 Essential (primary) hypertension: Secondary | ICD-10-CM | POA: Diagnosis not present

## 2021-05-17 DIAGNOSIS — E7849 Other hyperlipidemia: Secondary | ICD-10-CM | POA: Diagnosis not present

## 2021-05-17 DIAGNOSIS — I2510 Atherosclerotic heart disease of native coronary artery without angina pectoris: Secondary | ICD-10-CM | POA: Diagnosis not present

## 2021-07-31 NOTE — Progress Notes (Signed)
Cardiology Office Note  Date: 08/01/2021   ID: DAVONTAY Nguyen, DOB 08/13/1951, MRN [id number]  PCP:  Curlene Labrum, MD  Cardiologist:  None Electrophysiologist:  None   Chief Complaint: Follow-up hypertension  History of Present Illness: Timothy Nguyen is a 70 y.o. male with a history of hypertension, abnormal stress test, GERD, HLD, esophageal dysphagia.  Last encounter 07/30/2019 via telemedicine visit with Dr. Bronson Ing.  History of presumed CAD based on abnormal stress test.  Stress test 2018 demonstrated a small fixed defect in the inferior wall consistent with prior infarct with no reversible ischemia.  LVEF was 44%.  Patient had been drinking between 1/2pint to 1 pint of blue leg liquor daily.  He had started back taking his antihypertensive medication but did not know the name of the medication.  He was symptomatically stable.  Continuing Crestor.  Stopped aspirin due to running out of the medication.  He was encouraged to restart aspirin 81 mg daily.  Blood pressure was markedly elevated but he had recently started another antihypertensive medication the prior day.  He did not know the name of the medication.  He was encouraged to reduce alcohol consumption.  He is here for 1 year follow-up.  States he is feeling well.  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  He states he has been boxing for a long time to keep in shape.  He denies any anginal or exertional symptoms, palpitations or arrhythmias, orthostatic symptoms, CVA or TIA-like symptoms, PND, orthopnea, DOE ED, SOB, bleeding issues, claudication-like symptoms, DVT or PE-like symptoms, lower extremity edema.  He states he just came from working out and he is having some shoulder pain which may account for his elevated heart rate and blood pressure.  States his blood pressure is normally in the 585-277 range systolic with diastolics of 82-42 at home.  He admits to occasional alcohol consumption.  He  states he does have some occasional dizziness but he has been diagnosed with vertigo and takes medication for his vertigo.  States he had some recent lab work with his primary care provider and everything was normal.  Past Medical History:  Diagnosis Date   Arthritis    Cancer ([place])    prostate, testicular   GERD (gastroesophageal reflux disease)    Hyperlipemia    Hypertension     Past Surgical History:  Procedure Laterality Date   BACK SURGERY     COLONOSCOPY  01/21/2012   Procedure: COLONOSCOPY;  Surgeon: Danie Binder, MD;  [facility];  Service: Endoscopy;  Laterality: N/A;  11:15 AM   ORCHIECTOMY     PROSTATECTOMY      Current Outpatient Medications  Medication Sig Dispense Refill   chlorthalidone (HYGROTON) 25 MG tablet Take 1 tablet by mouth every other day.     famotidine (PEPCID) 20 MG tablet Take 20 mg by mouth 2 (two) times daily.     lisinopril (PRINIVIL,ZESTRIL) 20 MG tablet Take 20 mg by mouth 2 (two) times daily.      Omega-3 Fatty Acids (OMEGA 3 500 PO) Take by mouth daily.      rosuvastatin (CRESTOR) 5 MG tablet Take 1 tablet (5 mg total) by mouth daily. 30 tablet 6   No current facility-administered medications for this visit.   Allergies:  Patient has no known allergies.   Social History: The patient  reports that he has been smoking cigarettes. He has been smoking an average  of .2 packs per day. He has never used smokeless tobacco. He reports current alcohol use. He reports that he does not use drugs.   Family History: The patient's family history includes Alzheimer's disease in his mother and paternal grandmother; Congestive Heart Failure in his father; Heart attack in his father; Prostate cancer in his brother.   ROS:  Please see the history of present illness. Otherwise, complete review of systems is positive for none.  All other systems are reviewed and negative.   Physical Exam: VS:  BP (!) 140/96   Pulse (!) 104   Ht 5\' 6"  (1.676 m)    Wt 158 lb 3.2 oz (71.8 kg)   SpO2 98%   BMI 25.53 kg/m , BMI Body mass index is 25.53 kg/m.  Wt Readings from Last 3 Encounters:  08/01/21 158 lb 3.2 oz (71.8 kg)  08/01/20 158 lb 12.8 oz (72 kg)  07/30/19 163 lb (73.9 kg)    General: Patient appears comfortable at rest. Neck: Supple, no elevated JVP or carotid bruits, no thyromegaly. Lungs: Clear to auscultation, nonlabored breathing at rest. Cardiac: Regular rate and rhythm, no S3 or significant systolic murmur, no pericardial rub. Extremities: No pitting edema, distal pulses 2+. Skin: Warm and dry. Musculoskeletal: No kyphosis. Neuropsychiatric: Alert and oriented x3, affect grossly appropriate.  ECG: 08/01/2021 sinus tachycardia rate of 105 nonspecific T wave abnormality.  Recent Labwork: No results found for requested labs within last 8760 hours.  No results found for: CHOL, TRIG, HDL, CHOLHDL, VLDL, LDLCALC, LDLDIRECT  Other Studies Reviewed Today:  Cardiolite nuclear medical stress test at [facility] on March 21, 2017.  1.  Small fixed defect in inferior wall consistent with prior infarct, no definitive reversible ischemia noted. 2.  Hypokinesia noted in the inferior wall. 3.  Left ventricular ejection fraction 44%. 4.  Noninvasive risk stratification: Intermediate  Assessment and Plan:  1. Abnormal stress test   2. Essential hypertension   3. Mixed hyperlipidemia     1. Abnormal stress test Previous abnormal stress test.  See report above.  No current anginal symptoms.   Previous CT scan and September 2019 demonstrated evidence of aortic atherosclerosis and atherosclerotic CAD.  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    2. Essential hypertension Blood pressure today is elevated at 140/96.  He states he just came from working out at [employer] and boxing a couple rounds per his statement also has some left shoulder pain which may be causing his blood pressure to go up.  States at home his  blood pressures run in the systolic range between 941-740.  Diastolic blood pressures run in the range between 60 and 80.  Continue lisinopril 20 mg daily.  Continue chlorthalidone 25 mg daily.  3.  Hyperlipidemia Continue Crestor 5 mg daily.  Continue omega-3 fatty acid daily.  Lipid panel at PCP office 06/01/2020: TC 186, TG 104, HDL 76, LDL 92.  Medication Adjustments/Labs and Tests Ordered: Current medicines are reviewed at length with the patient today.  Concerns regarding medicines are outlined above.   Disposition: Follow-up with Dr. Harl Bowie or APP 6 months.  Signed, Levell July, NP 08/01/2021 3:40 PM    [facility] at [place], [place], [place] [address] Phone: [phone]; Fax: [phone]

## 2021-08-01 ENCOUNTER — Ambulatory Visit: Payer: Medicare HMO | Admitting: Family Medicine

## 2021-08-01 ENCOUNTER — Encounter: Payer: Self-pay | Admitting: Family Medicine

## 2021-08-01 VITALS — BP 140/96 | HR 104 | Ht 66.0 in | Wt 158.2 lb

## 2021-08-01 DIAGNOSIS — I10 Essential (primary) hypertension: Secondary | ICD-10-CM | POA: Diagnosis not present

## 2021-08-01 DIAGNOSIS — E782 Mixed hyperlipidemia: Secondary | ICD-10-CM | POA: Diagnosis not present

## 2021-08-01 DIAGNOSIS — R9439 Abnormal result of other cardiovascular function study: Secondary | ICD-10-CM

## 2021-08-01 NOTE — Patient Instructions (Addendum)

## 2021-08-16 DIAGNOSIS — I10 Essential (primary) hypertension: Secondary | ICD-10-CM | POA: Diagnosis not present

## 2021-08-16 DIAGNOSIS — I2510 Atherosclerotic heart disease of native coronary artery without angina pectoris: Secondary | ICD-10-CM | POA: Diagnosis not present

## 2021-08-16 DIAGNOSIS — Z720 Tobacco use: Secondary | ICD-10-CM | POA: Diagnosis not present

## 2021-08-16 DIAGNOSIS — E7849 Other hyperlipidemia: Secondary | ICD-10-CM | POA: Diagnosis not present

## 2021-09-12 DIAGNOSIS — E875 Hyperkalemia: Secondary | ICD-10-CM | POA: Diagnosis not present

## 2021-09-12 DIAGNOSIS — Z1329 Encounter for screening for other suspected endocrine disorder: Secondary | ICD-10-CM | POA: Diagnosis not present

## 2021-09-12 DIAGNOSIS — K219 Gastro-esophageal reflux disease without esophagitis: Secondary | ICD-10-CM | POA: Diagnosis not present

## 2021-09-12 DIAGNOSIS — E7849 Other hyperlipidemia: Secondary | ICD-10-CM | POA: Diagnosis not present

## 2021-09-12 DIAGNOSIS — I10 Essential (primary) hypertension: Secondary | ICD-10-CM | POA: Diagnosis not present

## 2021-09-12 DIAGNOSIS — E782 Mixed hyperlipidemia: Secondary | ICD-10-CM | POA: Diagnosis not present

## 2021-09-15 DIAGNOSIS — I10 Essential (primary) hypertension: Secondary | ICD-10-CM | POA: Diagnosis not present

## 2021-09-15 DIAGNOSIS — I2510 Atherosclerotic heart disease of native coronary artery without angina pectoris: Secondary | ICD-10-CM | POA: Diagnosis not present

## 2021-09-15 DIAGNOSIS — Z720 Tobacco use: Secondary | ICD-10-CM | POA: Diagnosis not present

## 2021-09-15 DIAGNOSIS — E7849 Other hyperlipidemia: Secondary | ICD-10-CM | POA: Diagnosis not present

## 2021-09-20 DIAGNOSIS — I2510 Atherosclerotic heart disease of native coronary artery without angina pectoris: Secondary | ICD-10-CM | POA: Diagnosis not present

## 2021-09-20 DIAGNOSIS — Z8546 Personal history of malignant neoplasm of prostate: Secondary | ICD-10-CM | POA: Diagnosis not present

## 2021-09-20 DIAGNOSIS — I10 Essential (primary) hypertension: Secondary | ICD-10-CM | POA: Diagnosis not present

## 2021-09-20 DIAGNOSIS — E875 Hyperkalemia: Secondary | ICD-10-CM | POA: Diagnosis not present

## 2021-09-20 DIAGNOSIS — I700 Atherosclerosis of aorta: Secondary | ICD-10-CM | POA: Diagnosis not present

## 2021-09-20 DIAGNOSIS — I77810 Thoracic aortic ectasia: Secondary | ICD-10-CM | POA: Diagnosis not present

## 2021-09-20 DIAGNOSIS — Z8547 Personal history of malignant neoplasm of testis: Secondary | ICD-10-CM | POA: Diagnosis not present

## 2022-01-02 ENCOUNTER — Encounter: Payer: Self-pay | Admitting: *Deleted

## 2022-01-03 DIAGNOSIS — E7849 Other hyperlipidemia: Secondary | ICD-10-CM | POA: Diagnosis not present

## 2022-01-03 DIAGNOSIS — E782 Mixed hyperlipidemia: Secondary | ICD-10-CM | POA: Diagnosis not present

## 2022-01-03 DIAGNOSIS — D519 Vitamin B12 deficiency anemia, unspecified: Secondary | ICD-10-CM | POA: Diagnosis not present

## 2022-01-03 DIAGNOSIS — D529 Folate deficiency anemia, unspecified: Secondary | ICD-10-CM | POA: Diagnosis not present

## 2022-01-03 DIAGNOSIS — D649 Anemia, unspecified: Secondary | ICD-10-CM | POA: Diagnosis not present

## 2022-01-03 DIAGNOSIS — I10 Essential (primary) hypertension: Secondary | ICD-10-CM | POA: Diagnosis not present

## 2022-01-08 DIAGNOSIS — I2510 Atherosclerotic heart disease of native coronary artery without angina pectoris: Secondary | ICD-10-CM | POA: Diagnosis not present

## 2022-01-08 DIAGNOSIS — E875 Hyperkalemia: Secondary | ICD-10-CM | POA: Diagnosis not present

## 2022-01-08 DIAGNOSIS — I77810 Thoracic aortic ectasia: Secondary | ICD-10-CM | POA: Diagnosis not present

## 2022-01-08 DIAGNOSIS — N183 Chronic kidney disease, stage 3 unspecified: Secondary | ICD-10-CM | POA: Diagnosis not present

## 2022-01-08 DIAGNOSIS — Z0001 Encounter for general adult medical examination with abnormal findings: Secondary | ICD-10-CM | POA: Diagnosis not present

## 2022-01-08 DIAGNOSIS — I10 Essential (primary) hypertension: Secondary | ICD-10-CM | POA: Diagnosis not present

## 2022-01-08 DIAGNOSIS — I700 Atherosclerosis of aorta: Secondary | ICD-10-CM | POA: Diagnosis not present

## 2022-01-18 DIAGNOSIS — R252 Cramp and spasm: Secondary | ICD-10-CM | POA: Diagnosis not present

## 2022-01-18 DIAGNOSIS — Z6826 Body mass index (BMI) 26.0-26.9, adult: Secondary | ICD-10-CM | POA: Diagnosis not present

## 2022-01-18 DIAGNOSIS — T733XXA Exhaustion due to excessive exertion, initial encounter: Secondary | ICD-10-CM | POA: Diagnosis not present

## 2022-01-18 DIAGNOSIS — I700 Atherosclerosis of aorta: Secondary | ICD-10-CM | POA: Diagnosis not present

## 2022-01-18 DIAGNOSIS — I2510 Atherosclerotic heart disease of native coronary artery without angina pectoris: Secondary | ICD-10-CM | POA: Diagnosis not present

## 2022-01-18 DIAGNOSIS — F17210 Nicotine dependence, cigarettes, uncomplicated: Secondary | ICD-10-CM | POA: Diagnosis not present

## 2022-01-18 DIAGNOSIS — N183 Chronic kidney disease, stage 3 unspecified: Secondary | ICD-10-CM | POA: Diagnosis not present

## 2022-01-26 DIAGNOSIS — N183 Chronic kidney disease, stage 3 unspecified: Secondary | ICD-10-CM | POA: Diagnosis not present

## 2022-01-26 DIAGNOSIS — E875 Hyperkalemia: Secondary | ICD-10-CM | POA: Diagnosis not present

## 2022-01-26 DIAGNOSIS — D649 Anemia, unspecified: Secondary | ICD-10-CM | POA: Diagnosis not present

## 2022-01-26 DIAGNOSIS — I10 Essential (primary) hypertension: Secondary | ICD-10-CM | POA: Diagnosis not present

## 2022-01-26 DIAGNOSIS — K219 Gastro-esophageal reflux disease without esophagitis: Secondary | ICD-10-CM | POA: Diagnosis not present

## 2022-02-02 DIAGNOSIS — F17210 Nicotine dependence, cigarettes, uncomplicated: Secondary | ICD-10-CM | POA: Diagnosis not present

## 2022-02-02 DIAGNOSIS — Z6824 Body mass index (BMI) 24.0-24.9, adult: Secondary | ICD-10-CM | POA: Diagnosis not present

## 2022-02-02 DIAGNOSIS — I10 Essential (primary) hypertension: Secondary | ICD-10-CM | POA: Diagnosis not present

## 2022-02-02 DIAGNOSIS — B029 Zoster without complications: Secondary | ICD-10-CM | POA: Diagnosis not present

## 2022-02-11 DIAGNOSIS — Z7982 Long term (current) use of aspirin: Secondary | ICD-10-CM | POA: Diagnosis not present

## 2022-02-11 DIAGNOSIS — M1990 Unspecified osteoarthritis, unspecified site: Secondary | ICD-10-CM | POA: Diagnosis not present

## 2022-02-11 DIAGNOSIS — E785 Hyperlipidemia, unspecified: Secondary | ICD-10-CM | POA: Diagnosis not present

## 2022-02-11 DIAGNOSIS — F17210 Nicotine dependence, cigarettes, uncomplicated: Secondary | ICD-10-CM | POA: Diagnosis not present

## 2022-02-11 DIAGNOSIS — B029 Zoster without complications: Secondary | ICD-10-CM | POA: Diagnosis not present

## 2022-02-11 DIAGNOSIS — B0222 Postherpetic trigeminal neuralgia: Secondary | ICD-10-CM | POA: Diagnosis not present

## 2022-02-11 DIAGNOSIS — R21 Rash and other nonspecific skin eruption: Secondary | ICD-10-CM | POA: Diagnosis not present

## 2022-02-11 DIAGNOSIS — Z79899 Other long term (current) drug therapy: Secondary | ICD-10-CM | POA: Diagnosis not present

## 2022-02-11 DIAGNOSIS — I10 Essential (primary) hypertension: Secondary | ICD-10-CM | POA: Diagnosis not present

## 2022-02-14 ENCOUNTER — Encounter: Payer: Self-pay | Admitting: *Deleted

## 2022-02-15 DIAGNOSIS — F17210 Nicotine dependence, cigarettes, uncomplicated: Secondary | ICD-10-CM | POA: Diagnosis not present

## 2022-02-15 DIAGNOSIS — Z6824 Body mass index (BMI) 24.0-24.9, adult: Secondary | ICD-10-CM | POA: Diagnosis not present

## 2022-02-15 DIAGNOSIS — B029 Zoster without complications: Secondary | ICD-10-CM | POA: Diagnosis not present

## 2022-02-23 DIAGNOSIS — Z6824 Body mass index (BMI) 24.0-24.9, adult: Secondary | ICD-10-CM | POA: Diagnosis not present

## 2022-02-23 DIAGNOSIS — F17210 Nicotine dependence, cigarettes, uncomplicated: Secondary | ICD-10-CM | POA: Diagnosis not present

## 2022-02-23 DIAGNOSIS — B029 Zoster without complications: Secondary | ICD-10-CM | POA: Diagnosis not present

## 2022-02-23 DIAGNOSIS — B0229 Other postherpetic nervous system involvement: Secondary | ICD-10-CM | POA: Diagnosis not present

## 2022-02-28 DIAGNOSIS — R42 Dizziness and giddiness: Secondary | ICD-10-CM | POA: Diagnosis not present

## 2022-02-28 DIAGNOSIS — R000 Tachycardia, unspecified: Secondary | ICD-10-CM | POA: Diagnosis not present

## 2022-02-28 DIAGNOSIS — R52 Pain, unspecified: Secondary | ICD-10-CM | POA: Diagnosis not present

## 2022-02-28 DIAGNOSIS — I959 Hypotension, unspecified: Secondary | ICD-10-CM | POA: Diagnosis not present

## 2022-03-16 DIAGNOSIS — E7849 Other hyperlipidemia: Secondary | ICD-10-CM | POA: Diagnosis not present

## 2022-03-16 DIAGNOSIS — I2510 Atherosclerotic heart disease of native coronary artery without angina pectoris: Secondary | ICD-10-CM | POA: Diagnosis not present

## 2022-03-16 DIAGNOSIS — Z720 Tobacco use: Secondary | ICD-10-CM | POA: Diagnosis not present

## 2022-03-19 DIAGNOSIS — N281 Cyst of kidney, acquired: Secondary | ICD-10-CM | POA: Diagnosis not present

## 2022-03-19 DIAGNOSIS — R197 Diarrhea, unspecified: Secondary | ICD-10-CM | POA: Diagnosis not present

## 2022-03-19 DIAGNOSIS — F17210 Nicotine dependence, cigarettes, uncomplicated: Secondary | ICD-10-CM | POA: Diagnosis not present

## 2022-03-19 DIAGNOSIS — K6389 Other specified diseases of intestine: Secondary | ICD-10-CM | POA: Diagnosis not present

## 2022-03-19 DIAGNOSIS — E785 Hyperlipidemia, unspecified: Secondary | ICD-10-CM | POA: Diagnosis not present

## 2022-03-19 DIAGNOSIS — Z7982 Long term (current) use of aspirin: Secondary | ICD-10-CM | POA: Diagnosis not present

## 2022-03-19 DIAGNOSIS — K56600 Partial intestinal obstruction, unspecified as to cause: Secondary | ICD-10-CM | POA: Diagnosis not present

## 2022-03-19 DIAGNOSIS — Z79899 Other long term (current) drug therapy: Secondary | ICD-10-CM | POA: Diagnosis not present

## 2022-03-19 DIAGNOSIS — R112 Nausea with vomiting, unspecified: Secondary | ICD-10-CM | POA: Diagnosis not present

## 2022-03-19 DIAGNOSIS — E860 Dehydration: Secondary | ICD-10-CM | POA: Diagnosis not present

## 2022-03-19 DIAGNOSIS — I10 Essential (primary) hypertension: Secondary | ICD-10-CM | POA: Diagnosis not present

## 2022-07-09 DIAGNOSIS — F17210 Nicotine dependence, cigarettes, uncomplicated: Secondary | ICD-10-CM | POA: Diagnosis not present

## 2022-07-09 DIAGNOSIS — Z6824 Body mass index (BMI) 24.0-24.9, adult: Secondary | ICD-10-CM | POA: Diagnosis not present

## 2022-07-09 DIAGNOSIS — B029 Zoster without complications: Secondary | ICD-10-CM | POA: Diagnosis not present

## 2022-07-09 DIAGNOSIS — R030 Elevated blood-pressure reading, without diagnosis of hypertension: Secondary | ICD-10-CM | POA: Diagnosis not present

## 2022-07-23 ENCOUNTER — Encounter: Payer: Self-pay | Admitting: *Deleted

## 2022-08-17 DIAGNOSIS — I10 Essential (primary) hypertension: Secondary | ICD-10-CM | POA: Diagnosis not present

## 2022-08-17 DIAGNOSIS — N1832 Chronic kidney disease, stage 3b: Secondary | ICD-10-CM | POA: Diagnosis not present

## 2022-08-17 DIAGNOSIS — Z6825 Body mass index (BMI) 25.0-25.9, adult: Secondary | ICD-10-CM | POA: Diagnosis not present

## 2022-08-17 DIAGNOSIS — F17210 Nicotine dependence, cigarettes, uncomplicated: Secondary | ICD-10-CM | POA: Diagnosis not present

## 2022-08-17 DIAGNOSIS — E039 Hypothyroidism, unspecified: Secondary | ICD-10-CM | POA: Diagnosis not present

## 2022-08-17 DIAGNOSIS — M160 Bilateral primary osteoarthritis of hip: Secondary | ICD-10-CM | POA: Diagnosis not present

## 2022-11-05 ENCOUNTER — Encounter (HOSPITAL_COMMUNITY): Payer: Self-pay

## 2022-11-05 DIAGNOSIS — Z7902 Long term (current) use of antithrombotics/antiplatelets: Secondary | ICD-10-CM | POA: Diagnosis not present

## 2022-11-05 DIAGNOSIS — Z79899 Other long term (current) drug therapy: Secondary | ICD-10-CM | POA: Diagnosis not present

## 2022-11-05 DIAGNOSIS — I214 Non-ST elevation (NSTEMI) myocardial infarction: Secondary | ICD-10-CM | POA: Diagnosis not present

## 2022-11-05 DIAGNOSIS — U071 COVID-19: Secondary | ICD-10-CM | POA: Diagnosis not present

## 2022-11-05 DIAGNOSIS — Z7982 Long term (current) use of aspirin: Secondary | ICD-10-CM | POA: Diagnosis not present

## 2022-11-05 DIAGNOSIS — R0600 Dyspnea, unspecified: Secondary | ICD-10-CM | POA: Diagnosis not present

## 2022-11-05 DIAGNOSIS — Z79891 Long term (current) use of opiate analgesic: Secondary | ICD-10-CM | POA: Diagnosis not present

## 2022-11-05 DIAGNOSIS — F17210 Nicotine dependence, cigarettes, uncomplicated: Secondary | ICD-10-CM | POA: Diagnosis not present

## 2022-11-05 DIAGNOSIS — J811 Chronic pulmonary edema: Secondary | ICD-10-CM | POA: Diagnosis not present

## 2022-11-05 DIAGNOSIS — R002 Palpitations: Secondary | ICD-10-CM | POA: Diagnosis not present

## 2022-11-05 DIAGNOSIS — R0602 Shortness of breath: Secondary | ICD-10-CM | POA: Diagnosis not present

## 2022-11-06 ENCOUNTER — Inpatient Hospital Stay (HOSPITAL_COMMUNITY): Payer: Medicare HMO

## 2022-11-06 ENCOUNTER — Inpatient Hospital Stay (HOSPITAL_COMMUNITY)
Admit: 2022-11-06 | Discharge: 2022-11-08 | DRG: 280 | Disposition: A | Discharge status: Home / Self Care | Payer: Medicare HMO | Source: Other Acute Inpatient Hospital | Attending: Family Medicine | Admitting: Family Medicine

## 2022-11-06 ENCOUNTER — Encounter (HOSPITAL_COMMUNITY): Payer: Self-pay | Admitting: Internal Medicine

## 2022-11-06 DIAGNOSIS — Z8042 Family history of malignant neoplasm of prostate: Secondary | ICD-10-CM

## 2022-11-06 DIAGNOSIS — Z8547 Personal history of malignant neoplasm of testis: Secondary | ICD-10-CM

## 2022-11-06 DIAGNOSIS — K219 Gastro-esophageal reflux disease without esophagitis: Secondary | ICD-10-CM | POA: Diagnosis present

## 2022-11-06 DIAGNOSIS — Z28310 Unvaccinated for covid-19: Secondary | ICD-10-CM | POA: Diagnosis not present

## 2022-11-06 DIAGNOSIS — U071 COVID-19: Principal | ICD-10-CM | POA: Diagnosis present

## 2022-11-06 DIAGNOSIS — Z820 Family history of epilepsy and other diseases of the nervous system: Secondary | ICD-10-CM

## 2022-11-06 DIAGNOSIS — F17210 Nicotine dependence, cigarettes, uncomplicated: Secondary | ICD-10-CM | POA: Diagnosis present

## 2022-11-06 DIAGNOSIS — E785 Hyperlipidemia, unspecified: Secondary | ICD-10-CM | POA: Diagnosis not present

## 2022-11-06 DIAGNOSIS — R0602 Shortness of breath: Secondary | ICD-10-CM | POA: Diagnosis not present

## 2022-11-06 DIAGNOSIS — I5031 Acute diastolic (congestive) heart failure: Secondary | ICD-10-CM | POA: Diagnosis not present

## 2022-11-06 DIAGNOSIS — R0600 Dyspnea, unspecified: Secondary | ICD-10-CM | POA: Diagnosis not present

## 2022-11-06 DIAGNOSIS — I214 Non-ST elevation (NSTEMI) myocardial infarction: Secondary | ICD-10-CM

## 2022-11-06 DIAGNOSIS — R002 Palpitations: Secondary | ICD-10-CM | POA: Diagnosis not present

## 2022-11-06 DIAGNOSIS — R7989 Other specified abnormal findings of blood chemistry: Secondary | ICD-10-CM | POA: Diagnosis not present

## 2022-11-06 DIAGNOSIS — J811 Chronic pulmonary edema: Secondary | ICD-10-CM | POA: Diagnosis not present

## 2022-11-06 DIAGNOSIS — I10 Essential (primary) hypertension: Secondary | ICD-10-CM | POA: Diagnosis present

## 2022-11-06 DIAGNOSIS — N179 Acute kidney failure, unspecified: Secondary | ICD-10-CM | POA: Diagnosis not present

## 2022-11-06 DIAGNOSIS — G9341 Metabolic encephalopathy: Secondary | ICD-10-CM | POA: Diagnosis present

## 2022-11-06 DIAGNOSIS — Z8249 Family history of ischemic heart disease and other diseases of the circulatory system: Secondary | ICD-10-CM

## 2022-11-06 DIAGNOSIS — N189 Chronic kidney disease, unspecified: Secondary | ICD-10-CM | POA: Diagnosis not present

## 2022-11-06 DIAGNOSIS — Z79899 Other long term (current) drug therapy: Secondary | ICD-10-CM | POA: Diagnosis not present

## 2022-11-06 DIAGNOSIS — Z7982 Long term (current) use of aspirin: Secondary | ICD-10-CM | POA: Diagnosis not present

## 2022-11-06 DIAGNOSIS — I509 Heart failure, unspecified: Secondary | ICD-10-CM

## 2022-11-06 DIAGNOSIS — Z79891 Long term (current) use of opiate analgesic: Secondary | ICD-10-CM | POA: Diagnosis not present

## 2022-11-06 DIAGNOSIS — F1010 Alcohol abuse, uncomplicated: Secondary | ICD-10-CM | POA: Diagnosis not present

## 2022-11-06 DIAGNOSIS — I130 Hypertensive heart and chronic kidney disease with heart failure and stage 1 through stage 4 chronic kidney disease, or unspecified chronic kidney disease: Secondary | ICD-10-CM | POA: Diagnosis not present

## 2022-11-06 DIAGNOSIS — Z7902 Long term (current) use of antithrombotics/antiplatelets: Secondary | ICD-10-CM | POA: Diagnosis not present

## 2022-11-06 LAB — TROPONIN I (HIGH SENSITIVITY)
Troponin I (High Sensitivity): 1592 ng/L (ref <18)
Troponin I (High Sensitivity): 1581 ng/L (ref <18)

## 2022-11-06 LAB — ECHOCARDIOGRAM COMPLETE
AR max vel: 2.37 cm2
AV Area VTI: 2.37 cm2
AV Area mean vel: 2.66 cm2
AV Mean grad: 3 mmHg
AV Peak grad: 7.2 mmHg
Ao pk vel: 1.34 m/s
Area-P 1/2: 6.77 cm2
Height: 66 in
MV VTI: 3.08 cm2
S' Lateral: 3.7 cm
Weight: 2640 oz

## 2022-11-06 LAB — HEMOGLOBIN A1C
Hgb A1c MFr Bld: 4.8 % (ref 4.8–5.6)
Mean Plasma Glucose: 91.06 mg/dL

## 2022-11-06 LAB — HEPARIN LEVEL (UNFRACTIONATED): Heparin Unfractionated: 0.44 IU/mL (ref 0.30–0.70)

## 2022-11-06 MED ORDER — ACETAMINOPHEN 325 MG PO TABS
650.0000 mg | ORAL_TABLET | ORAL | Status: DC | PRN
  Administered 2022-11-07 (×2): 650 mg via ORAL
  Filled 2022-11-06 (×2): qty 2

## 2022-11-06 MED ORDER — ASPIRIN 300 MG RE SUPP
300.0000 mg | RECTAL | Status: DC
  Filled 2022-11-06: qty 1

## 2022-11-06 MED ORDER — CARVEDILOL 3.125 MG PO TABS
3.1250 mg | ORAL_TABLET | Freq: Two times a day (BID) | ORAL | Status: DC
  Administered 2022-11-06 – 2022-11-08 (×4): 3.125 mg via ORAL
  Filled 2022-11-06 (×4): qty 1

## 2022-11-06 MED ORDER — ADULT MULTIVITAMIN W/MINERALS CH
1.0000 | ORAL_TABLET | Freq: Every day | ORAL | Status: DC
  Administered 2022-11-07 – 2022-11-08 (×2): 1 via ORAL
  Filled 2022-11-06 (×2): qty 1

## 2022-11-06 MED ORDER — GABAPENTIN 300 MG PO CAPS
300.0000 mg | ORAL_CAPSULE | Freq: Three times a day (TID) | ORAL | Status: DC
  Administered 2022-11-06 – 2022-11-08 (×6): 300 mg via ORAL
  Filled 2022-11-06 (×6): qty 1

## 2022-11-06 MED ORDER — ASPIRIN 81 MG PO CHEW: 324.0000 mg | CHEWABLE_TABLET | ORAL | Status: DC

## 2022-11-06 MED ORDER — LORAZEPAM 2 MG/ML IJ SOLN: 1.0000 mg | INTRAMUSCULAR | Status: DC | PRN

## 2022-11-06 MED ORDER — THIAMINE MONONITRATE 100 MG PO TABS
100.0000 mg | ORAL_TABLET | Freq: Every day | ORAL | Status: DC
  Administered 2022-11-07 – 2022-11-08 (×2): 100 mg via ORAL
  Filled 2022-11-06 (×2): qty 1

## 2022-11-06 MED ORDER — ASPIRIN 81 MG PO TBEC
81.0000 mg | DELAYED_RELEASE_TABLET | Freq: Every day | ORAL | Status: DC
  Administered 2022-11-07 – 2022-11-08 (×2): 81 mg via ORAL
  Filled 2022-11-06 (×4): qty 1

## 2022-11-06 MED ORDER — NITROGLYCERIN 0.4 MG SL SUBL: 0.4000 mg | SUBLINGUAL_TABLET | SUBLINGUAL | Status: DC | PRN

## 2022-11-06 MED ORDER — FUROSEMIDE 10 MG/ML IJ SOLN: 40.0000 mg | Freq: Four times a day (QID) | INTRAMUSCULAR | Status: DC

## 2022-11-06 MED ORDER — HYDROCODONE-ACETAMINOPHEN 5-325 MG PO TABS: 1.0000 | ORAL_TABLET | ORAL | Status: DC | PRN

## 2022-11-06 MED ORDER — CHLORHEXIDINE GLUCONATE CLOTH 2 % EX PADS
6.0000 | MEDICATED_PAD | Freq: Every day | CUTANEOUS | Status: DC
  Administered 2022-11-06: 6 via TOPICAL

## 2022-11-06 MED ORDER — SODIUM CHLORIDE 0.9 % IV SOLN: INTRAVENOUS | Status: DC

## 2022-11-06 MED ORDER — PANTOPRAZOLE SODIUM 40 MG PO TBEC
40.0000 mg | DELAYED_RELEASE_TABLET | Freq: Every day | ORAL | Status: DC
  Administered 2022-11-06 – 2022-11-08 (×3): 40 mg via ORAL
  Filled 2022-11-06 (×3): qty 1

## 2022-11-06 MED ORDER — LISINOPRIL 20 MG PO TABS: 20.0000 mg | ORAL_TABLET | Freq: Every day | ORAL | Status: DC

## 2022-11-06 MED ORDER — FOLIC ACID 1 MG PO TABS
1.0000 mg | ORAL_TABLET | Freq: Every day | ORAL | Status: DC
  Administered 2022-11-07 – 2022-11-08 (×2): 1 mg via ORAL
  Filled 2022-11-06 (×2): qty 1

## 2022-11-06 MED ORDER — HEPARIN (PORCINE) 25000 UT/250ML-% IV SOLN
1150.0000 [IU]/h | INTRAVENOUS | Status: DC
  Administered 2022-11-06: 1000 [IU]/h via INTRAVENOUS
  Filled 2022-11-06: qty 250

## 2022-11-06 MED ORDER — ROSUVASTATIN CALCIUM 5 MG PO TABS
5.0000 mg | ORAL_TABLET | Freq: Every day | ORAL | Status: DC
  Administered 2022-11-06 – 2022-11-07 (×2): 5 mg via ORAL
  Filled 2022-11-06 (×2): qty 1

## 2022-11-06 MED ORDER — THIAMINE HCL 100 MG/ML IJ SOLN
100.0000 mg | Freq: Every day | INTRAMUSCULAR | Status: DC
  Filled 2022-11-06: qty 2

## 2022-11-06 MED ORDER — LORAZEPAM 1 MG PO TABS
1.0000 mg | ORAL_TABLET | ORAL | Status: DC | PRN
  Administered 2022-11-07: 1 mg via ORAL
  Filled 2022-11-06: qty 1

## 2022-11-06 MED ORDER — ONDANSETRON HCL 4 MG/2ML IJ SOLN: 4.0000 mg | Freq: Four times a day (QID) | INTRAMUSCULAR | Status: DC | PRN

## 2022-11-06 NOTE — Assessment & Plan Note (Signed)
Patient with no prior h/o CHF now with mild CHF on CXR 11/05/22 and elevated BNP. Acute CHF associated with NSTEMI  Plan 2 D echo stat  Lasix IV 40 mg q8 x 3 doses  Continue ACE-I - elevated Cr noted  BB added.

## 2022-11-06 NOTE — Assessment & Plan Note (Signed)
Patient w/o chest pain but dramatic rise in Troponin: 28 to 178 to 1,835. EKG w/o acute changes. Hemodynamically stable.   Plan BB as above  Heparin qtt - pharmacy consult full dose  Cardiology consult

## 2022-11-06 NOTE — Assessment & Plan Note (Signed)
Patient on ACE-I and asa. BP elevated at admission  Plan Continue lisinopril  Add Coreg daily  Lasix IV x 3 doses

## 2022-11-06 NOTE — Progress Notes (Signed)
   11/06/22 1106  Assess: MEWS Score  Temp 98.5 F (36.9 C)  BP (!) 140/87  MAP (mmHg) 99  Pulse Rate (!) 130  ECG Heart Rate (!) 132  Resp 20  Level of Consciousness Alert  SpO2 94 %  O2 Device Nasal Cannula  O2 Flow Rate (L/min) 2 L/min  Assess: MEWS Score  MEWS Temp 0  MEWS Systolic 0  MEWS Pulse 3  MEWS RR 0  MEWS LOC 0  MEWS Score 3  MEWS Score Color Yellow  Assess: if the MEWS score is Yellow or Red  Were vital signs taken at a resting state? Yes  Focused Assessment No change from prior assessment  Does the patient meet 2 or more of the SIRS criteria? No  MEWS guidelines implemented  Yes, yellow  Treat  MEWS Interventions Considered administering scheduled or prn medications/treatments as ordered  Take Vital Signs  Increase Vital Sign Frequency  Yellow: Q2hr x1, continue Q4hrs until patient remains green for 12hrs  Escalate  MEWS: Escalate Yellow: Discuss with charge nurse and consider notifying provider and/or RRT  Notify: Charge Nurse/RN  Name of Charge Nurse/RN Notified [email or web address]  Assess: SIRS CRITERIA  SIRS Temperature  0  SIRS Pulse 1  SIRS Respirations  0  SIRS WBC 0  SIRS Score Sum  1   Yellow mews on admission. HR elevated, reported to be 120s, 140s with activity. Pt stated his BP is usually high. When [facility] picked up pt at [facility], pt had oral temp 102.8 F, 650 mg tylenol given. Upon arrival to [place], pt oral temp came down to 98.5 F.

## 2022-11-06 NOTE — H&P (Signed)
History and Physical    VITALY JASMER [id number] DOB: 04/13/1951 DOA: 11/06/2022  DOS: the patient was seen and examined on 11/06/2022  PCP: Curlene Labrum, MD   Patient coming from:  transfer from [place] have personally briefly reviewed patient's old medical records in [facility] Link  Mr. Arth, a 72 y/o with h/o testicular cancer, HLD, HTN, back pain presented to [name] 11/05/22 due to 2 hrs of SOB. He denied any chest pain, fever, chills, endorsed a minor cough. Denied loss of taste or smell. He has not had Covid vaccine. In the ED EKG with minor abnormalities w/o ST elevation. Troponnins 28 to 178 to 1,835-last on 11/06/22 at 02:35. Di-dimer was 417. Cmet with Cr 1.75, CBCD nl. Pro-BNP 3,315. CXR 11/05/22 with mild CHF. Per ED note cardiology at [facility] contacted recommending heparin qtt and transfer. [name] called and accepted transfer.   ED Course: seen in ED at [facility] stable. Troponina to 1,835, BNP to 3,315, CXR with mild CHF 11/05/22. Cardiology called -recommended heparin infusion and Hospitalist admit. [name] called when patient arrived  Review of Systems:  Review of Systems  Reason unable to perform ROS: limited - poor recall.  Constitutional:  Negative for chills, diaphoresis, fever, malaise/fatigue and weight loss.  HENT: Negative.    Eyes: Negative.   Respiratory:  Positive for cough and shortness of breath. Negative for wheezing.   Cardiovascular:  Negative for chest pain, palpitations and leg swelling.  Gastrointestinal:  Positive for heartburn.  Genitourinary: Negative.   Musculoskeletal: Negative.   Skin: Negative.   Neurological: Negative.   Endo/Heme/Allergies: Negative.   Psychiatric/Behavioral: Negative.      Past Medical History:  Diagnosis Date   Arthritis    Cancer [facility])    prostate, testicular   GERD (gastroesophageal reflux disease)    Hyperlipemia    Hypertension     Past Surgical History:  Procedure Laterality  Date   BACK SURGERY     COLONOSCOPY  01/21/2012   Procedure: COLONOSCOPY;  Surgeon: Danie Binder, MD;  Location: [facility];  Service: Endoscopy;  Laterality: N/A;  11:15 AM   ORCHIECTOMY     PROSTATECTOMY      Soc Hx - divorced many years ago. Has one daughter. Worked for [employer] of [employer] in [place].   reports that he has been smoking cigarettes. He has been smoking an average of .2 packs per day. He has never used smokeless tobacco. He reports current alcohol use. He reports that he does not use drugs.  No Known Allergies  Family History  Problem Relation Age of Onset   Alzheimer's disease Mother    Heart attack Father    Congestive Heart Failure Father    Prostate cancer Brother    Alzheimer's disease Paternal Grandmother     Prior to Admission medications   Medication Sig Start Date End Date Taking? Authorizing Provider  lisinopril (PRINIVIL,ZESTRIL) 20 MG tablet Take 20 mg by mouth 2 (two) times daily.    Yes [provider]  chlorthalidone (HYGROTON) 25 MG tablet Take 1 tablet by mouth every other day. 06/22/21   [provider]  famotidine (PEPCID) 20 MG tablet Take 20 mg by mouth 2 (two) times daily.    [provider]  gabapentin (NEURONTIN) 300 MG capsule Take 600 mg by mouth 2 (two) times daily. 09/18/22   [provider]  Omega-3 Fatty Acids (OMEGA 3 500 PO) Take by mouth daily.  [provider]  rosuvastatin (CRESTOR) 5 MG tablet Take 1 tablet (5 mg total) by mouth daily. 05/07/17 08/01/21  Herminio Commons, MD    Physical Exam: Vitals:   11/06/22 1100 11/06/22 1106 11/06/22 1256 11/06/22 1432  BP:  (!) 140/87 (!) 120/104 114/64  Pulse:  (!) 130 (!) 106 93  Resp:  20 16 20  $ Temp: (!) 100.4 F (38 C) 98.5 F (36.9 C)    TempSrc: Oral Oral  Oral  SpO2:  94% 98% 96%    Physical Exam Vitals and nursing note reviewed.  Constitutional:      General: He is not in acute distress.    Appearance: Normal  appearance. He is normal weight. He is not ill-appearing.  HENT:     Head: Normocephalic and atraumatic.     Mouth/Throat:     Mouth: Mucous membranes are moist.     Pharynx: Oropharynx is clear. No oropharyngeal exudate.  Eyes:     Extraocular Movements: Extraocular movements intact.     Conjunctiva/sclera: Conjunctivae normal.     Pupils: Pupils are equal, round, and reactive to light.  Neck:     Vascular: No carotid bruit.  Cardiovascular:     Rate and Rhythm: Normal rate and regular rhythm.     Pulses: Normal pulses.     Heart sounds: Normal heart sounds.  Pulmonary:     Effort: Pulmonary effort is normal. No respiratory distress.     Breath sounds: No wheezing or rales.  Abdominal:     Palpations: Abdomen is soft.  Musculoskeletal:        General: Normal range of motion.     Cervical back: Normal range of motion and neck supple.     Right lower leg: No edema.     Left lower leg: No edema.  Skin:    General: Skin is warm and dry.  Neurological:     Mental Status: He is alert.     Cranial Nerves: No cranial nerve deficit.     Comments: Poor memory recall of events-blames on hunger  Psychiatric:        Mood and Affect: Mood normal.        Behavior: Behavior normal.      Labs on Admission: I have personally reviewed following labs and imaging studies  CBC: No results for input(s): "WBC", "NEUTROABS", "HGB", "HCT", "MCV", "PLT" in the last 168 hours. Basic Metabolic Panel: No results for input(s): "NA", "K", "CL", "CO2", "GLUCOSE", "BUN", "CREATININE", "CALCIUM", "MG", "PHOS" in the last 168 hours. GFR: CrCl cannot be calculated (Patient's most recent lab result is older than the maximum 21 days allowed.). Liver Function Tests: No results for input(s): "AST", "ALT", "ALKPHOS", "BILITOT", "PROT", "ALBUMIN" in the last 168 hours. No results for input(s): "LIPASE", "AMYLASE" in the last 168 hours. No results for input(s): "AMMONIA" in the last 168 hours. Coagulation  Profile: No results for input(s): "INR", "PROTIME" in the last 168 hours. Cardiac Enzymes: No results for input(s): "CKTOTAL", "CKMB", "CKMBINDEX", "TROPONINI" in the last 168 hours. BNP (last 3 results) No results for input(s): "PROBNP" in the last 8760 hours. HbA1C: No results for input(s): "HGBA1C" in the last 72 hours. CBG: No results for input(s): "GLUCAP" in the last 168 hours. Lipid Profile: No results for input(s): "CHOL", "HDL", "LDLCALC", "TRIG", "CHOLHDL", "LDLDIRECT" in the last 72 hours. Thyroid Function Tests: No results for input(s): "TSH", "T4TOTAL", "FREET4", "T3FREE", "THYROIDAB" in the last 72 hours. Anemia Panel: No results for input(s): "VITAMINB12", "FOLATE", "  FERRITIN", "TIBC", "IRON", "RETICCTPCT" in the last 72 hours. Urine analysis: No results found for: "COLORURINE", "APPEARANCEUR", "LABSPEC", "PHURINE", "GLUCOSEU", "HGBUR", "BILIRUBINUR", "KETONESUR", "PROTEINUR", "UROBILINOGEN", "NITRITE", "LEUKOCYTESUR"  Radiological Exams on Admission: I have personally reviewed images No results found.  EKG: I have personally reviewed EKG: outside EKG - no ST elevation sinus rhythm.  Assessment/Plan Principal Problem:   NSTEMI (non-ST elevated myocardial infarction) ([place]) Active Problems:   Acute CHF ([place])   COVID-19 virus infection   Hypertension   Hyperlipidemia    Assessment and Plan: * NSTEMI (non-ST elevated myocardial infarction) [facility]) Patient w/o chest pain but dramatic rise in Troponin: 28 to 178 to 1,835. EKG w/o acute changes. Hemodynamically stable.   Plan BB as above  Heparin qtt - pharmacy consult full dose  Cardiology consult  Acute CHF [facility]) Patient with no prior h/o CHF now with mild CHF on CXR 11/05/22 and elevated BNP. Acute CHF associated with NSTEMI  Plan 2 D echo stat  Lasix IV 40 mg q8 x 3 doses  Continue ACE-I - elevated Cr noted  BB added.  COVID-19 virus infection Patient not hypoxemic, afebrile, CXR w/o  infiltrates.  Plan Supportive care  Hyperlipidemia On rosuvastatin.  Plan Lipid Panel in AM ` Continue rosuvastatin  Hypertension Patient on ACE-I and asa. BP elevated at admission  Plan Continue lisinopril  Add Coreg daily  Lasix IV x 3 doses       DVT prophylaxis: IV heparin gtts Code Status: Full Code Family Communication: spoke with patients brother  Disposition Plan: TBD  Consults called: Cardiology- Dr, Marlou Porch  Admission status: Inpatient, Telemetry bed   Adella Hare, MD [facility] 11/06/2022, 2:39 PM

## 2022-11-06 NOTE — Progress Notes (Addendum)
ANTICOAGULATION CONSULT NOTE - Initial Consult  Pharmacy Consult for heparin Indication: chest pain/ACS  No Known Allergies  Patient Measurements: Height: 5' 6"$  (167.6 cm) Weight: 74.8 kg (165 lb) IBW/kg (Calculated) : 63.8 Heparin Dosing Weight: 75kg  Vital Signs: Temp: 98.5 F (36.9 C) (02/20 1106) Temp Source: Oral (02/20 1432) BP: 114/64 (02/20 1432) Pulse Rate: 93 (02/20 1432)  Labs: No results for input(s): "HGB", "HCT", "PLT", "APTT", "LABPROT", "INR", "HEPARINUNFRC", "HEPRLOWMOCWT", "CREATININE", "CKTOTAL", "CKMB", "TROPONINIHS" in the last 72 hours.  CrCl cannot be calculated (Patient's most recent lab result is older than the maximum 21 days allowed.).   Medical History: Past Medical History:  Diagnosis Date   Arthritis    Cancer ([place])    prostate, testicular   GERD (gastroesophageal reflux disease)    Hyperlipemia    Hypertension     Medications:  Medications Prior to Admission  Medication Sig Dispense Refill Last Dose   lisinopril (PRINIVIL,ZESTRIL) 20 MG tablet Take 20 mg by mouth 2 (two) times daily.    11/05/2022   chlorthalidone (HYGROTON) 25 MG tablet Take 1 tablet by mouth every other day.      famotidine (PEPCID) 20 MG tablet Take 20 mg by mouth 2 (two) times daily.      gabapentin (NEURONTIN) 300 MG capsule Take 600 mg by mouth 2 (two) times daily.      Omega-3 Fatty Acids (OMEGA 3 500 PO) Take by mouth daily.       rosuvastatin (CRESTOR) 5 MG tablet Take 1 tablet (5 mg total) by mouth daily. 30 tablet 6    Scheduled:   aspirin  324 mg Oral NOW   Or   aspirin  300 mg Rectal NOW   [START ON 11/07/2022] aspirin EC  81 mg Oral Daily   carvedilol  3.125 mg Oral BID WC   Chlorhexidine Gluconate Cloth  6 each Topical Daily   furosemide  40 mg Intravenous Q6H   gabapentin  300 mg Oral TID   lisinopril  20 mg Oral Daily   pantoprazole  40 mg Oral Daily   rosuvastatin  5 mg Oral Daily   Infusions:   sodium chloride      Assessment: Pt  presented to [facility] with short of breath and with elevated troponin. He was started on heparin there and the level was therapeutic this AM. He was tx here for further workup.  Scr 1.82 Hgb 11.8 Plt wnl  Goal of Therapy:  Heparin level 0.3-0.7 units/ml Monitor platelets by anticoagulation protocol: Yes   Plan:  Continue heparin at 1000 units/hr Check confirm level Daily HL/CBC  Onnie Boer, PharmD, BCIDP, AAHIVP, CPP Infectious Disease Pharmacist 11/06/2022 3:01 PM  Addendum  Heparin came back therapeutic again. Cont current rate.  Onnie Boer, PharmD, BCIDP, AAHIVP, CPP Infectious Disease Pharmacist 11/06/2022 4:47 PM

## 2022-11-06 NOTE — Assessment & Plan Note (Signed)
On rosuvastatin.  Plan Lipid Panel in AM ` Continue rosuvastatin

## 2022-11-06 NOTE — Progress Notes (Signed)
Alert to person, month and year, but confused to place twice during my assessment , after re orienting him. He thinks he is in [facility]. Figeting and pulling at monitoring device. States he drinks 3-4 times a week and a half pint of liquor during that time. His HR is 120s ST now. See flow sheet for VS. BP elevated. He said his head feels, "swimmy." Notified Dr Marlowe Sax on call via text page for a CIWA protocol order if needed. He stated his last drink was Sat night he thinks.

## 2022-11-06 NOTE — Assessment & Plan Note (Signed)
Patient not hypoxemic, afebrile, CXR w/o infiltrates.  Plan Supportive care

## 2022-11-06 NOTE — Consult Note (Signed)
Cardiology Consultation   Patient ID: Timothy Nguyen MRN: [id number]; DOB: [birth year]  Admit date: 11/06/2022 Date of Consult: 11/06/2022  PCP:  Curlene Labrum, MD   [place] Providers Cardiologist:  will follow in [place], previously Bronson Ing   Patient Profile:   Timothy Nguyen is a 72 y.o. male with a hx of HTN, GERD, HLD, esophageal dysphagia, and CAD who is being seen 11/06/2022 for the evaluation of elevated troponin in the setting of COVID infection at the request of Dr. Linda Hedges.  History of Present Illness:   Mr. Bihl previously followed with Dr. Bronson Ing with abnormal nuclear stress test in 2018 with small fixed defect in inferior wall consistent with prior infarct. He has noted coronary calcifications on CT chest 2019.  He was last seen in clinic with NP Leonides Sake 07/2021. At that time, he was drinking 0.5-1 pint of liquor daily and intermittent medication noncompliant. He previously exercised with boxing. He denied angina.   He presented to [facility] with shortness of breath x 2 hours found to be COVID-19 positive. He denied chest pain, N/V, and LE edema. Question history of panic/anxiety as he sometimes has shortness of breath relieved with xanax.   HST 28 --> 178 --> 1835 Pro-BNP > 3000 EKG ST HR 125, LVH   Cardiology consulted at [facility] and was accepted for consult. Pt admitted to medicine service.  During my interview, he denies chest pain, lower extremity edema, orthopnea, and PND. He denies weight gain. He has not eaten in 2 days. He continues to exercise 4-5 times weekly with weight training and boxing. 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 He denies ever having chest pain with these activities. He continues to drink 1 pint of alcohol 3-4 times weekly. He denies tobacco abuse. He denies family history of heart disease.   Past Medical History:  Diagnosis Date   Arthritis    Cancer [facility])    prostate,  testicular   GERD (gastroesophageal reflux disease)    Hyperlipemia    Hypertension     Past Surgical History:  Procedure Laterality Date   BACK SURGERY     COLONOSCOPY  01/21/2012   Procedure: COLONOSCOPY;  Surgeon: Danie Binder, MD;  Location: [facility];  Service: Endoscopy;  Laterality: N/A;  11:15 AM   ORCHIECTOMY     PROSTATECTOMY       Home Medications:  Prior to Admission medications   Medication Sig Start Date End Date Taking? Authorizing Provider  lisinopril (PRINIVIL,ZESTRIL) 20 MG tablet Take 20 mg by mouth 2 (two) times daily.    Yes [provider]  chlorthalidone (HYGROTON) 25 MG tablet Take 1 tablet by mouth every other day. 06/22/21   [provider]  famotidine (PEPCID) 20 MG tablet Take 20 mg by mouth 2 (two) times daily.    [provider]  gabapentin (NEURONTIN) 300 MG capsule Take 600 mg by mouth 2 (two) times daily. 09/18/22   [provider]  Omega-3 Fatty Acids (OMEGA 3 500 PO) Take by mouth daily.     [provider]  rosuvastatin (CRESTOR) 5 MG tablet Take 1 tablet (5 mg total) by mouth daily. 05/07/17 08/01/21  Herminio Commons, MD    Inpatient Medications: Scheduled Meds:  aspirin  324 mg Oral NOW   Or   aspirin  300 mg Rectal NOW   [START ON 11/07/2022] aspirin EC  81 mg Oral Daily  carvedilol  3.125 mg Oral BID WC   Chlorhexidine Gluconate Cloth  6 each Topical Daily   gabapentin  300 mg Oral TID   lisinopril  20 mg Oral Daily   pantoprazole  40 mg Oral Daily   rosuvastatin  5 mg Oral Daily   Continuous Infusions:  sodium chloride     PRN Meds: acetaminophen, HYDROcodone-acetaminophen, nitroGLYCERIN, ondansetron (ZOFRAN) IV  Allergies:   No Known Allergies  Social History:   Social History   Socioeconomic History   Marital status: Single    Spouse name: Not on file   Number of children: Not on file   Years of education: Not on file   Highest education level: Not on file  Occupational  History   Not on file  Tobacco Use   Smoking status: Some Days    Packs/day: 0.20    Types: Cigarettes   Smokeless tobacco: Never   Tobacco comments:    just smokes when he drinks not often   Vaping Use   Vaping Use: Never used  Substance and Sexual Activity   Alcohol use: Yes    Comment: occasional   Drug use: No   Sexual activity: Not on file  Other Topics Concern   Not on file  Social History Narrative   Not on file   Social Determinants of Health   Financial Resource Strain: Not on file  Food Insecurity: Not on file  Transportation Needs: Not on file  Physical Activity: Not on file  Stress: Not on file  Social Connections: Not on file  Intimate Partner Violence: Not on file    Family History:    Family History  Problem Relation Age of Onset   Alzheimer's disease Mother    Heart attack Father    Congestive Heart Failure Father    Prostate cancer Brother    Alzheimer's disease Paternal Grandmother      ROS:  Please see the history of present illness.   All other ROS reviewed and negative.     Physical Exam/Data:   Vitals:   11/06/22 1100 11/06/22 1106 11/06/22 1256 11/06/22 1432  BP:  (!) 140/87 (!) 120/104 114/64  Pulse:  (!) 130 (!) 106 93  Resp:  20 16 20  $ Temp: (!) 100.4 F (38 C) 98.5 F (36.9 C)    TempSrc: Oral Oral  Oral  SpO2:  94% 98% 96%   No intake or output data in the 24 hours ending 11/06/22 1441    08/01/2021    2:58 PM 08/01/2020    1:41 PM 07/30/2019    9:33 AM  Last 3 Weights  Weight (lbs) 158 lb 3.2 oz 158 lb 12.8 oz 163 lb  Weight (kg) 71.759 kg 72.031 kg 73.936 kg     There is no height or weight on file to calculate BMI.  General:  NAD, but appears cold, although he denies this HEENT: normal Neck: no JVD Vascular: No carotid bruits; Distal pulses 2+ bilaterally Cardiac:  normal S1, S2; regular rhythm tachycardic rate Lungs:  clear to auscultation bilaterally, no wheezing, rhonchi or rales  Abd: soft, nontender, no  hepatomegaly  Ext: no edema Musculoskeletal:  No deformities, BUE and BLE strength normal and equal Skin: warm and dry  Neuro:  CNs 2-12 intact, no focal abnormalities noted Psych:  Normal affect   EKG:  The EKG was personally reviewed and demonstrates:  pending Telemetry:  Telemetry was personally reviewed and demonstrates:  sinus to sinus tachycardia with HR 90-110s with  PACs  Relevant CV Studies:  Echo pending  Laboratory Data:  High Sensitivity Troponin:  No results for input(s): "TROPONINIHS" in the last 720 hours.   ChemistryNo results for input(s): "NA", "K", "CL", "CO2", "GLUCOSE", "BUN", "CREATININE", "CALCIUM", "MG", "GFRNONAA", "GFRAA", "ANIONGAP" in the last 168 hours.  No results for input(s): "PROT", "ALBUMIN", "AST", "ALT", "ALKPHOS", "BILITOT" in the last 168 hours. Lipids No results for input(s): "CHOL", "TRIG", "HDL", "LABVLDL", "LDLCALC", "CHOLHDL" in the last 168 hours.  HematologyNo results for input(s): "WBC", "RBC", "HGB", "HCT", "MCV", "MCH", "MCHC", "RDW", "PLT" in the last 168 hours. Thyroid No results for input(s): "TSH", "FREET4" in the last 168 hours.  BNPNo results for input(s): "BNP", "PROBNP" in the last 168 hours.  DDimer No results for input(s): "DDIMER" in the last 168 hours.   Radiology/Studies:  No results found.   Assessment and Plan:   NSTEMI HS troponin 28 --> 178 --> 1835 EKG pending Pt denies chest pain Has a history of abnormal nuclear stress test consistent with fixed defect suspicious for prior infarct.  Will obtain echocardiogram I think it makes sense to treat with 48 hrs heparin, trend 2 more troponins here If EKG changes or abnormal echo, would then need to consider ischemic evaluation. However, given lack of symptoms, may be able to defer until he is afebrile.  - agree with heparin, 81 mg ASA, and carvedilol  - would increase crestor to 20 mg, obtain repeat lipid panel as below   Elevated pro-BNP Pro-BNP > 3000 CXR by  report with "?pulmonary venous hypertension but no frank edema no effusion" at [facility] Does not appear extremely volume up on exam, denies recent orthopnea and PND Will obtain echocardiogram Currently warm and perfusing Newly recognized cardiomyopathy would not be surprising given his alcohol use +/- CAD - ordered for 40 mg IV lasix q 6hr - consider reducing to once today and we can monitor response   Hypertension Home meds listed: 20 mg lisinopril BID + 25 mg chlorthalidone BMP pending, but AKI on labs from OSH Consider holding lisinopril and chlorthalidone given elevated creatinine   Hyperlipidemia  Will collect lipid panel Maintained on 5 mg crestor --> increase this to 20 mg   AKI but likely component of CKD sCr 1.82 Unclear baseline, sCr 1.75 on 03/2022 Repeat BMP pending   COVID-19 Positive test this admission Per primary   Risk factor modification Occasional smoker  A1c 4.8%    Risk Assessment/Risk Scores:     TIMI Risk Score for Unstable Angina or Non-ST Elevation MI:   The patient's TIMI risk score is  , which indicates a  % risk of all cause mortality, new or recurrent myocardial infarction or need for urgent revascularization in the next 14 days.  New York Heart Association (NYHA) Functional Class NYHA Class II        For questions or updates, please contact [place] Please consult [email or web address] for contact info under    Signed, Ledora Bottcher, [place]  11/06/2022 2:41 PM

## 2022-11-06 NOTE — Subjective & Objective (Signed)
Mr. Ader, a 72 y/o with h/o testicular cancer, HLD, HTN, back pain presented to [name] 11/05/22 due to 2 hrs of SOB. He denied any chest pain, fever, chills, endorsed a minor cough. Denied loss of taste or smell. He has not had Covid vaccine. In the ED EKG with minor abnormalities w/o ST elevation. Troponnins 28 to 178 to 1,835-last on 11/06/22 at 02:35. Di-dimer was 417. Cmet with Cr 1.75, CBCD nl. Pro-BNP 3,315. CXR 11/05/22 with mild CHF. Per ED note cardiology at [facility] contacted recommending heparin qtt and transfer. [name] called and accepted transfer.

## 2022-11-06 NOTE — Progress Notes (Signed)
*  PRELIMINARY RESULTS* Echocardiogram 2D Echocardiogram has been performed.  Timothy Nguyen 11/06/2022, 4:36 PM

## 2022-11-07 ENCOUNTER — Inpatient Hospital Stay (HOSPITAL_COMMUNITY): Payer: Medicare HMO

## 2022-11-07 DIAGNOSIS — I214 Non-ST elevation (NSTEMI) myocardial infarction: Secondary | ICD-10-CM | POA: Diagnosis not present

## 2022-11-07 LAB — BASIC METABOLIC PANEL
Anion gap: 9 (ref 5–15)
BUN: 19 mg/dL (ref 8–23)
CO2: 25 mmol/L (ref 22–32)
Calcium: 9 mg/dL (ref 8.9–10.3)
Chloride: 98 mmol/L (ref 98–111)
Creatinine, Ser: 1.75 mg/dL — ABNORMAL HIGH (ref 0.61–1.24)
GFR, Estimated: 41 mL/min — ABNORMAL LOW (ref >60)
Glucose, Bld: 104 mg/dL — ABNORMAL HIGH (ref 70–99)
Potassium: 4 mmol/L (ref 3.5–5.1)
Sodium: 132 mmol/L — ABNORMAL LOW (ref 135–145)

## 2022-11-07 LAB — BRAIN NATRIURETIC PEPTIDE: B Natriuretic Peptide: 270.2 pg/mL — ABNORMAL HIGH (ref 0.0–100.0)

## 2022-11-07 LAB — HEPARIN LEVEL (UNFRACTIONATED): Heparin Unfractionated: 0.22 IU/mL — ABNORMAL LOW (ref 0.30–0.70)

## 2022-11-07 MED ORDER — HEPARIN SODIUM (PORCINE) 5000 UNIT/ML IJ SOLN
5000.0000 [IU] | Freq: Three times a day (TID) | INTRAMUSCULAR | Status: DC
  Administered 2022-11-07 – 2022-11-08 (×3): 5000 [IU] via SUBCUTANEOUS
  Filled 2022-11-07 (×3): qty 1

## 2022-11-07 MED ORDER — NIRMATRELVIR/RITONAVIR (PAXLOVID) TABLET (RENAL DOSING)
2.0000 | ORAL_TABLET | Freq: Two times a day (BID) | ORAL | Status: DC
  Administered 2022-11-07 – 2022-11-08 (×2): 2 via ORAL
  Filled 2022-11-07: qty 20

## 2022-11-07 NOTE — Plan of Care (Signed)
  Problem: Education: Goal: Knowledge of General Education information will improve Description: Including pain rating scale, medication(s)/side effects and non-pharmacologic comfort measures Outcome: Progressing   Problem: Health Behavior/Discharge Planning: Goal: Ability to manage health-related needs will improve Outcome: Progressing   Problem: Clinical Measurements: Goal: Ability to maintain clinical measurements within normal limits will improve Outcome: Progressing Goal: Will remain free from infection Outcome: Progressing Goal: Diagnostic test results will improve Outcome: Progressing Goal: Respiratory complications will improve Outcome: Progressing Goal: Cardiovascular complication will be avoided Outcome: Progressing   Problem: Activity: Goal: Risk for activity intolerance will decrease Outcome: Progressing   Problem: Nutrition: Goal: Adequate nutrition will be maintained Outcome: Progressing   Problem: Coping: Goal: Level of anxiety will decrease Outcome: Progressing   Problem: Elimination: Goal: Will not experience complications related to bowel motility Outcome: Progressing Goal: Will not experience complications related to urinary retention Outcome: Progressing   Problem: Pain Managment: Goal: General experience of comfort will improve Outcome: Progressing   Problem: Safety: Goal: Ability to remain free from injury will improve Outcome: Progressing   Problem: Skin Integrity: Goal: Risk for impaired skin integrity will decrease Outcome: Progressing   Problem: Education: Goal: Understanding of cardiac disease, CV risk reduction, and recovery process will improve Outcome: Progressing Goal: Individualized Educational Video(s) Outcome: Progressing   Problem: Activity: Goal: Ability to tolerate increased activity will improve Outcome: Progressing   Problem: Cardiac: Goal: Ability to achieve and maintain adequate cardiovascular perfusion will  improve Outcome: Progressing   Problem: Health Behavior/Discharge Planning: Goal: Ability to safely manage health-related needs after discharge will improve Outcome: Progressing   Problem: Education: Goal: Knowledge of risk factors and measures for prevention of condition will improve Outcome: Progressing   Problem: Coping: Goal: Psychosocial and spiritual needs will be supported Outcome: Progressing   Problem: Respiratory: Goal: Will maintain a patent airway Outcome: Progressing Goal: Complications related to the disease process, condition or treatment will be avoided or minimized Outcome: Progressing

## 2022-11-07 NOTE — Progress Notes (Signed)
PROGRESS NOTE    SIRIUS LUMPP  [id number] DOB: Oct 10, 1950 DOA: 11/06/2022 PCP: Curlene Labrum, MD  No chief complaint on file.   Brief Narrative:   Mr. Alatorre, Jb Dulworth 72 y/o with h/o testicular cancer, HLD, HTN, back pain presented to [name] 11/05/22 due to 2 hrs of SOB. He denied any chest pain, fever, chills, endorsed Ellery Tash minor cough. Denied loss of taste or smell. He has not had Covid vaccine. In the ED EKG with minor abnormalities w/o ST elevation. Troponnins 28 to 178 to 1,835-last on 11/06/22 at 02:35. Di-dimer was 417. Cmet with Cr 1.75, CBCD nl. Pro-BNP 3,315. CXR 11/05/22 with mild CHF. Per ED note cardiology at [facility] contacted recommending heparin qtt and transfer. [name] called and accepted transfer.    ED Course: seen in ED at [place] stable. Troponina to 1,835, BNP to 3,315, CXR with mild CHF 11/05/22. Cardiology called -recommended heparin infusion and Hospitalist admit. [name] called when patient arrived    Assessment & Plan:   Principal Problem:   NSTEMI (non-ST elevated myocardial infarction) ([place]) Active Problems:   Acute CHF ([place])   COVID-19 virus infection   Hypertension   Hyperlipidemia  NSTEMI (non-ST elevated myocardial infarction) [facility]) Patient w/o chest pain but dramatic rise in Troponin: 28 to 178 to 1,835.  Now downtrending. Negative D dimer at OSH Echo without RWMA - EF 50-55%.  Grade 1 diastolic dysfunction. A1c 4.8, lipids pending Appreciate cardiology assistance - they suspect this is related to COVID 19 infection, no plans for additional cardiac eval at this point.   Acute CHF [facility]) Patient with no prior h/o CHF now with mild CHF on CXR 11/05/22 and elevated BNP. Acute CHF associated with NSTEMI Echo as above Hold further diuretics, appears euvolemic at this time Outpatient cards follow up  COVID-19 virus infection Fever to 100.4 on 2/20.  No O2 requirement.  CXR at outside hospital with mild pulm edema CXR today without acute  abnormality - suspect abnormal CXR due to mild edema given quick resolution Discussed risks/benefits paxlovid, will start this tonight (renal dosing)  COVID-19 Labs  No results for input(s): "DDIMER", "FERRITIN", "LDH", "CRP" in the last 72 hours.  No results found for: "SARSCOV2NAA"  ETOH Abuse CIWA   Hyperlipidemia Hold crestor with plan for paxlovid above   Hypertension Continue coreg Has lisinopril and chlorthalidone listed as home meds, these are currently on hold    DVT prophylaxis: heparin Code Status: full Family Communication: none Disposition:   Status is: Inpatient Remains inpatient appropriate because: pending further improvement   Consultants:  cardiology  Procedures:  Echo IMPRESSIONS     1. Left ventricular ejection fraction, by estimation, is 50 to 55%. The  left ventricle has low normal function. The left ventricle has no regional  wall motion abnormalities. There is mild concentric left ventricular  hypertrophy. Left ventricular  diastolic parameters are consistent with Grade I diastolic dysfunction  (impaired relaxation).   2. Right ventricular systolic function is hyperdynamic. The right  ventricular size is normal. There is normal pulmonary artery systolic  pressure.   3. Abnormal mitral valve thickening with diastolic doming (best seen in  A4C view) with abnormal leaflet motion. Consistent with Lashelle Koy post  inflammatory sequelae. MVA by PHT 6.88 cm2. The mitral valve is abnormal.  No evidence of mitral valve regurgitation.  No evidence of mitral stenosis.   4. The aortic valve was not well visualized. Aortic valve regurgitation  is not visualized. No aortic stenosis is  present.   Comparison(s): No prior Echocardiogram.   Antimicrobials:  Anti-infectives (From admission, onward)    Start     Dose/Rate Route Frequency Ordered Stop   11/07/22 2200  nirmatrelvir/ritonavir (renal dosing) (PAXLOVID) 2 tablet        2 tablet Oral 2 times daily  11/07/22 1719 11/12/22 2159       Subjective: Doesn't feel good overall Discussed paxlovid, risks/benefits, he's willing to try  Objective: Vitals:   11/07/22 0951 11/07/22 1308 11/07/22 1318 11/07/22 1717  BP:    134/87  Pulse:    100  Resp:   18   Temp: 98 F (36.7 C)  99.9 F (37.7 C)   TempSrc:  Oral Oral   SpO2:      Weight:      Height:        Intake/Output Summary (Last 24 hours) at 11/07/2022 1719 Last data filed at 11/07/2022 1552 Gross per 24 hour  Intake 1074.78 ml  Output 250 ml  Net 824.78 ml   Filed Weights   11/06/22 1432 11/07/22 0429  Weight: 74.8 kg 69.8 kg    Examination:  General exam: Appears calm and comfortable  Respiratory system: diminished Cardiovascular system: RRR. Gastrointestinal system: Abdomen is nondistended, soft and nontender.  Central nervous system: Alert and oriented. No focal neurological deficits. Extremities: no LEE   Data Reviewed: I have personally reviewed following labs and imaging studies  CBC: No results for input(s): "WBC", "NEUTROABS", "HGB", "HCT", "MCV", "PLT" in the last 168 hours.  Basic Metabolic Panel: Recent Labs  Lab 11/07/22 0335  NA 132*  K 4.0  CL 98  CO2 25  GLUCOSE 104*  BUN 19  CREATININE 1.75*  CALCIUM 9.0    GFR: Estimated Creatinine Clearance: 34.4 mL/min (Ansar Skoda) (by C-G formula based on SCr of 1.75 mg/dL (H)).  Liver Function Tests: No results for input(s): "AST", "ALT", "ALKPHOS", "BILITOT", "PROT", "ALBUMIN" in the last 168 hours.  CBG: No results for input(s): "GLUCAP" in the last 168 hours.   No results found for this or any previous visit (from the past 240 hour(s)).       Radiology Studies: DG CHEST PORT 1 VIEW  Result Date: 11/07/2022 CLINICAL DATA:  Edema EXAM: PORTABLE CHEST 1 VIEW COMPARISON:  Portable exam 0928 hours compared to 11/06/2022 FINDINGS: Normal heart size, mediastinal contours, and pulmonary vascularity. Lungs clear. No infiltrate, pleural effusion,  or pneumothorax. Pulmonary edema seen on previous exam resolved. Osseous structures unremarkable. IMPRESSION: No acute abnormalities. Resolution of mild pulmonary edema seen on prior study. Electronically Signed   By: Lavonia Dana M.D.   On: 11/07/2022 10:27   ECHOCARDIOGRAM COMPLETE  Result Date: 11/06/2022    ECHOCARDIOGRAM REPORT   Patient Name:   HANANIAH SESTITO Date of Exam: 11/06/2022 Medical Rec #:  [id number]       Height:       66.0 in Accession #:    [id number]      Weight:       165.0 lb Date of Birth:  1951-08-19       BSA:          1.843 m Patient Age:    35 years        BP:           114/64 mmHg Patient Gender: M               HR:           110 bpm. Exam Location:  Inpatient Procedure: 2D Echo, Cardiac Doppler and Color Doppler STAT ECHO Indications:    NSTEMI  History:        Patient has no prior history of Echocardiogram examinations.                 CHF, Acute MI; Risk Factors:Hypertension and Dyslipidemia. COVID                 +.  Sonographer:    Wenda Low Referring Phys: Eugene  1. Left ventricular ejection fraction, by estimation, is 50 to 55%. The left ventricle has low normal function. The left ventricle has no regional wall motion abnormalities. There is mild concentric left ventricular hypertrophy. Left ventricular diastolic parameters are consistent with Grade I diastolic dysfunction (impaired relaxation).  2. Right ventricular systolic function is hyperdynamic. The right ventricular size is normal. There is normal pulmonary artery systolic pressure.  3. Abnormal mitral valve thickening with diastolic doming (best seen in A4C view) with abnormal leaflet motion. Consistent with Brihanna Devenport post inflammatory sequelae. MVA by PHT 6.88 cm2. The mitral valve is abnormal. No evidence of mitral valve regurgitation. No evidence of mitral stenosis.  4. The aortic valve was not well visualized. Aortic valve regurgitation is not visualized. No aortic stenosis is present.  Comparison(s): No prior Echocardiogram. FINDINGS  Left Ventricle: Left ventricular ejection fraction, by estimation, is 50 to 55%. The left ventricle has low normal function. The left ventricle has no regional wall motion abnormalities. The left ventricular internal cavity size was normal in size. There is mild concentric left ventricular hypertrophy. Left ventricular diastolic parameters are consistent with Grade I diastolic dysfunction (impaired relaxation). Right Ventricle: The right ventricular size is normal. No increase in right ventricular wall thickness. Right ventricular systolic function is hyperdynamic. There is normal pulmonary artery systolic pressure. The tricuspid regurgitant velocity is 2.73 m/s, and with an assumed right atrial pressure of 3 mmHg, the estimated right ventricular systolic pressure is 0000000 mmHg. Left Atrium: Left atrial size was normal in size. Right Atrium: Right atrial size was normal in size. Pericardium: There is no evidence of pericardial effusion. Mitral Valve: Abnormal mitral valve thickening with diastolic doming (best seen in A4C view) with abnormal leaflet motion. Consistent with Koleton Duchemin post inflammatory sequelae. MVA by PHT 6.88 cm2. The mitral valve is abnormal. No evidence of mitral valve regurgitation. No evidence of mitral valve stenosis. MV peak gradient, 5.5 mmHg. The mean mitral valve gradient is 2.0 mmHg. Tricuspid Valve: The tricuspid valve is normal in structure. Tricuspid valve regurgitation is not demonstrated. No evidence of tricuspid stenosis. Aortic Valve: The aortic valve was not well visualized. Aortic valve regurgitation is not visualized. No aortic stenosis is present. Aortic valve mean gradient measures 3.0 mmHg. Aortic valve peak gradient measures 7.2 mmHg. Aortic valve area, by VTI measures 2.37 cm. Pulmonic Valve: The pulmonic valve was normal in structure. Pulmonic valve regurgitation is not visualized. No evidence of pulmonic stenosis. Aorta: The aortic  root is normal in size and structure. IAS/Shunts: No atrial level shunt detected by color flow Doppler.  LEFT VENTRICLE PLAX 2D LVIDd:         5.00 cm   Diastology LVIDs:         3.70 cm   LV e' medial:    5.87 cm/s LV PW:         1.30 cm   LV E/e' medial:  8.8 LV IVS:        1.20 cm  LV e' lateral:   7.72 cm/s LVOT diam:     1.90 cm   LV E/e' lateral: 6.7 LV SV:         58 LV SV Index:   31 LVOT Area:     2.84 cm  RIGHT VENTRICLE RV Basal diam:  3.15 cm RV Mid diam:    2.60 cm RV S prime:     22.90 cm/s TAPSE (M-mode): 2.7 cm LEFT ATRIUM             Index        RIGHT ATRIUM           Index LA diam:        4.00 cm 2.17 cm/m   RA Area:     17.90 cm LA Vol (A2C):   46.9 ml 25.45 ml/m  RA Volume:   47.50 ml  25.78 ml/m LA Vol (A4C):   38.1 ml 20.67 ml/m LA Biplane Vol: 42.6 ml 23.12 ml/m  AORTIC VALVE                    PULMONIC VALVE AV Area (Vmax):    2.37 cm     PV Vmax:       0.87 m/s AV Area (Vmean):   2.66 cm     PV Peak grad:  3.0 mmHg AV Area (VTI):     2.37 cm AV Vmax:           134.00 cm/s AV Vmean:          82.500 cm/s AV VTI:            0.243 m AV Peak Grad:      7.2 mmHg AV Mean Grad:      3.0 mmHg LVOT Vmax:         112.00 cm/s LVOT Vmean:        77.300 cm/s LVOT VTI:          0.203 m LVOT/AV VTI ratio: 0.84  AORTA Ao Root diam: 3.40 cm MITRAL VALVE               TRICUSPID VALVE MV Area (PHT): 6.77 cm    TR Peak grad:   29.8 mmHg MV Area VTI:   3.08 cm    TR Vmax:        273.00 cm/s MV Peak grad:  5.5 mmHg MV Mean grad:  2.0 mmHg    SHUNTS MV Vmax:       1.17 m/s    Systemic VTI:  0.20 m MV Vmean:      60.8 cm/s   Systemic Diam: 1.90 cm MV Decel Time: 112 msec MV E velocity: 51.80 cm/s MV Welton Bord velocity: 92.10 cm/s MV E/Teruko Joswick ratio:  0.56 Rudean Haskell MD Electronically signed by Rudean Haskell MD Signature Date/Time: 11/06/2022/4:51:32 PM    Final         Scheduled Meds:  aspirin EC  81 mg Oral Daily   carvedilol  3.125 mg Oral BID WC   folic acid  1 mg Oral Daily   gabapentin   300 mg Oral TID   heparin injection (subcutaneous)  5,000 Units Subcutaneous Q8H   multivitamin with minerals  1 tablet Oral Daily   nirmatrelvir/ritonavir (renal dosing)  2 tablet Oral BID   pantoprazole  40 mg Oral Daily   thiamine  100 mg Oral Daily   Or   thiamine  100 mg Intravenous Daily   Continuous Infusions:  sodium chloride 10  mL/hr at 11/06/22 1650     LOS: 1 day    Time spent: over 30 min    Fayrene Helper, MD [facility]   To contact the attending provider between 7A-7P or the covering provider during after hours 7P-7A, please log into the web site [email or web address] and access using universal [place] password for that web site. If you do not have the password, please call the hospital operator.  11/07/2022, 5:19 PM

## 2022-11-07 NOTE — Progress Notes (Signed)
ANTICOAGULATION CONSULT NOTE - Follow Up  Pharmacy Consult for heparin Indication: chest pain/ACS  No Known Allergies  Patient Measurements: Height: 5' 6"$  (167.6 cm) Weight: 69.8 kg (153 lb 14.1 oz) IBW/kg (Calculated) : 63.8 Heparin Dosing Weight: 75kg  Vital Signs: Temp: 97.6 F (36.4 C) (02/21 0429) Temp Source: Oral (02/21 0429) BP: 145/89 (02/21 0429) Pulse Rate: 87 (02/21 0429)  Labs: Recent Labs    11/06/22 1449 11/06/22 1534 11/06/22 1646 11/07/22 0335  HEPARINUNFRC  --  0.44  --  0.22*  CREATININE  --   --   --  1.75*  TROPONINIHS 1,592*  --  1,581*  --     Estimated Creatinine Clearance: 34.4 mL/min (A) (by C-G formula based on SCr of 1.75 mg/dL (H)).   Medical History: Past Medical History:  Diagnosis Date   Arthritis    Cancer ([place])    prostate, testicular   GERD (gastroesophageal reflux disease)    Hyperlipemia    Hypertension     Medications:  Medications Prior to Admission  Medication Sig Dispense Refill Last Dose   lisinopril (PRINIVIL,ZESTRIL) 20 MG tablet Take 20 mg by mouth 2 (two) times daily.    11/05/2022   chlorthalidone (HYGROTON) 25 MG tablet Take 1 tablet by mouth every other day.      famotidine (PEPCID) 20 MG tablet Take 20 mg by mouth 2 (two) times daily.      gabapentin (NEURONTIN) 300 MG capsule Take 600 mg by mouth 2 (two) times daily.      Omega-3 Fatty Acids (OMEGA 3 500 PO) Take by mouth daily.       rosuvastatin (CRESTOR) 5 MG tablet Take 1 tablet (5 mg total) by mouth daily. 30 tablet 6    Scheduled:   aspirin  324 mg Oral NOW   Or   aspirin  300 mg Rectal NOW   aspirin EC  81 mg Oral Daily   carvedilol  3.125 mg Oral BID WC   Chlorhexidine Gluconate Cloth  6 each Topical Daily   folic acid  1 mg Oral Daily   gabapentin  300 mg Oral TID   multivitamin with minerals  1 tablet Oral Daily   pantoprazole  40 mg Oral Daily   rosuvastatin  5 mg Oral Daily   thiamine  100 mg Oral Daily   Or   thiamine  100 mg  Intravenous Daily   Infusions:   sodium chloride 10 mL/hr at 11/06/22 1650   heparin 1,000 Units/hr (11/06/22 2119)    Assessment: Pt presented to [facility] with short of breath and with elevated troponin. He was started on heparin there and the level was therapeutic this AM. He was tx here for further workup. Scr 1.82, Hgb 11.8, plts WNL  Heparin level returned at 0.22 on rate of 1000 units/hr. Per RN, no issues with the infusion running or drawing dose. No signs/symptoms of bleeding.   Goal of Therapy:  Heparin level 0.3-0.7 units/ml Monitor platelets by anticoagulation protocol: Yes   Plan:  Increase heparin infusion to 1150 units/hr Check heparin level in 8 hours Monitor daily heparin level and CBC Monitor for signs/symptoms of bleeding   Sandford Craze, PharmD. [facility] Acute Care PGY-1 11/07/2022 5:06 AM

## 2022-11-07 NOTE — Progress Notes (Signed)
    Echo as noted below:   IMPRESSIONS     1. Left ventricular ejection fraction, by estimation, is 50 to 55%. The  left ventricle has low normal function. The left ventricle has no regional  wall motion abnormalities. There is mild concentric left ventricular  hypertrophy. Left ventricular  diastolic parameters are consistent with Grade I diastolic dysfunction  (impaired relaxation).   2. Right ventricular systolic function is hyperdynamic. The right  ventricular size is normal. There is normal pulmonary artery systolic  pressure.   3. Abnormal mitral valve thickening with diastolic doming (best seen in  A4C view) with abnormal leaflet motion. Consistent with a post  inflammatory sequelae. MVA by PHT 6.88 cm2. The mitral valve is abnormal.  No evidence of mitral valve regurgitation.  No evidence of mitral stenosis.   4. The aortic valve was not well visualized. Aortic valve regurgitation  is not visualized. No aortic stenosis is present.   Comparison(s): No prior Echocardiogram.   FINDINGS   Left Ventricle: Left ventricular ejection fraction, by estimation, is 50  to 55%. The left ventricle has low normal function. The left ventricle has  no regional wall motion abnormalities. The left ventricular internal  cavity size was normal in size.  There is mild concentric left ventricular hypertrophy. Left ventricular  diastolic parameters are consistent with Grade I diastolic dysfunction  (impaired relaxation).   Right Ventricle: The right ventricular size is normal. No increase in  right ventricular wall thickness. Right ventricular systolic function is  hyperdynamic. There is normal pulmonary artery systolic pressure. The  tricuspid regurgitant velocity is 2.73  m/s, and with an assumed right atrial pressure of 3 mmHg, the estimated  right ventricular systolic pressure is 0000000 mmHg.   Left Atrium: Left atrial size was normal in size.   Right Atrium: Right atrial size was  normal in size.   Pericardium: There is no evidence of pericardial effusion.   Mitral Valve: Abnormal mitral valve thickening with diastolic doming (best  seen in A4C view) with abnormal leaflet motion. Consistent with a post  inflammatory sequelae. MVA by PHT 6.88 cm2. The mitral valve is abnormal.  No evidence of mitral valve  regurgitation. No evidence of mitral valve stenosis. MV peak gradient, 5.5  mmHg. The mean mitral valve gradient is 2.0 mmHg.   Tricuspid Valve: The tricuspid valve is normal in structure. Tricuspid  valve regurgitation is not demonstrated. No evidence of tricuspid  stenosis.   Aortic Valve: The aortic valve was not well visualized. Aortic valve  regurgitation is not visualized. No aortic stenosis is present. Aortic  valve mean gradient measures 3.0 mmHg. Aortic valve peak gradient measures  7.2 mmHg. Aortic valve area, by VTI  measures 2.37 cm.   Pulmonic Valve: The pulmonic valve was normal in structure. Pulmonic valve  regurgitation is not visualized. No evidence of pulmonic stenosis.   Aorta: The aortic root is normal in size and structure.   IAS/Shunts: No atrial level shunt detected by color flow Doppler.    Normal LVEF and no rWMA noted on echo. Suspect elevated troponin related to demand ischemia in the setting of active COVID infection. EKG on admission without ischemic changes. Will stop IV heparin. Continued treatment per primary for COVID. Will arrange for outpatient cardiology follow up in the office.    Barnet Pall, NP-C 11/07/2022, 9:03 AM Pager: [phone]

## 2022-11-08 ENCOUNTER — Other Ambulatory Visit (HOSPITAL_COMMUNITY): Payer: Self-pay

## 2022-11-08 DIAGNOSIS — I214 Non-ST elevation (NSTEMI) myocardial infarction: Secondary | ICD-10-CM | POA: Diagnosis not present

## 2022-11-08 LAB — COMPREHENSIVE METABOLIC PANEL
ALT: 22 U/L (ref 0–44)
AST: 40 U/L (ref 15–41)
Albumin: 3.2 g/dL — ABNORMAL LOW (ref 3.5–5.0)
Alkaline Phosphatase: 40 U/L (ref 38–126)
Anion gap: 10 (ref 5–15)
BUN: 20 mg/dL (ref 8–23)
CO2: 25 mmol/L (ref 22–32)
Calcium: 9.4 mg/dL (ref 8.9–10.3)
Chloride: 97 mmol/L — ABNORMAL LOW (ref 98–111)
Creatinine, Ser: 1.7 mg/dL — ABNORMAL HIGH (ref 0.61–1.24)
GFR, Estimated: 42 mL/min — ABNORMAL LOW (ref >60)
Glucose, Bld: 98 mg/dL (ref 70–99)
Potassium: 3.8 mmol/L (ref 3.5–5.1)
Sodium: 132 mmol/L — ABNORMAL LOW (ref 135–145)
Total Bilirubin: 0.6 mg/dL (ref 0.3–1.2)
Total Protein: 6.2 g/dL — ABNORMAL LOW (ref 6.5–8.1)

## 2022-11-08 LAB — CBC WITH DIFFERENTIAL/PLATELET
Abs Immature Granulocytes: 0.01 10*3/uL (ref 0.00–0.07)
Basophils Absolute: 0 10*3/uL (ref 0.0–0.1)
Basophils Relative: 1 %
Eosinophils Absolute: 0 10*3/uL (ref 0.0–0.5)
Eosinophils Relative: 1 %
HCT: 36 % — ABNORMAL LOW (ref 39.0–52.0)
Hemoglobin: 12.2 g/dL — ABNORMAL LOW (ref 13.0–17.0)
Immature Granulocytes: 0 %
Lymphocytes Relative: 26 %
Lymphs Abs: 0.9 10*3/uL (ref 0.7–4.0)
MCH: 30.7 pg (ref 26.0–34.0)
MCHC: 33.9 g/dL (ref 30.0–36.0)
MCV: 90.5 fL (ref 80.0–100.0)
Monocytes Absolute: 0.6 10*3/uL (ref 0.1–1.0)
Monocytes Relative: 16 %
Neutro Abs: 1.9 10*3/uL (ref 1.7–7.7)
Neutrophils Relative %: 56 %
Platelets: 193 10*3/uL (ref 150–400)
RBC: 3.98 MIL/uL — ABNORMAL LOW (ref 4.22–5.81)
RDW: 12.1 % (ref 11.5–15.5)
WBC: 3.5 10*3/uL — ABNORMAL LOW (ref 4.0–10.5)
nRBC: 0 % (ref 0.0–0.2)

## 2022-11-08 LAB — LIPID PANEL
Cholesterol: 124 mg/dL (ref 0–200)
HDL: 43 mg/dL (ref >40)
LDL Cholesterol: 61 mg/dL (ref 0–99)
Total CHOL/HDL Ratio: 2.9 RATIO
Triglycerides: 102 mg/dL (ref <150)
VLDL: 20 mg/dL (ref 0–40)

## 2022-11-08 LAB — LIPOPROTEIN A (LPA): Lipoprotein (a): 31.6 nmol/L — ABNORMAL HIGH (ref <75.0)

## 2022-11-08 LAB — PHOSPHORUS: Phosphorus: 3.6 mg/dL (ref 2.5–4.6)

## 2022-11-08 LAB — MAGNESIUM: Magnesium: 2.2 mg/dL (ref 1.7–2.4)

## 2022-11-08 LAB — C-REACTIVE PROTEIN: CRP: 4.8 mg/dL — ABNORMAL HIGH (ref <1.0)

## 2022-11-08 MED ORDER — NIRMATRELVIR/RITONAVIR (PAXLOVID) TABLET (RENAL DOSING)
2.0000 | ORAL_TABLET | Freq: Two times a day (BID) | ORAL | 0 refills | Status: AC
  Filled 2022-11-08: qty 20, 5d supply, fill #0

## 2022-11-08 MED ORDER — CARVEDILOL 3.125 MG PO TABS
3.1250 mg | ORAL_TABLET | Freq: Two times a day (BID) | ORAL | 1 refills | Status: AC
  Filled 2022-11-08: qty 60, 30d supply, fill #0

## 2022-11-08 MED ORDER — ASPIRIN 81 MG PO TBEC
81.0000 mg | DELAYED_RELEASE_TABLET | Freq: Every day | ORAL | 1 refills | Status: AC
  Filled 2022-11-08: qty 30, 30d supply, fill #0

## 2022-11-08 MED ORDER — ROSUVASTATIN CALCIUM 5 MG PO TABS: 5.0000 mg | ORAL_TABLET | Freq: Every day | ORAL | Status: AC

## 2022-11-08 NOTE — Evaluation (Signed)
Physical Therapy Evaluation Patient Details Name: Timothy Nguyen MRN: [id number] DOB: 20-Jul-1951 Today's Date: 11/08/2022  History of Present Illness  72 y/o presented to [name] 11/05/22 due to 2 hrs of SOB. In ED vitals stable. Troponina to 1,835, BNP to 3,315, CXR with mild CHF Found to have COVID. Transferred to [facility] 2/20 for caridiology consult for NSTEMI PMH: testicular cancer, HLD, HTN, back pain  Clinical Impression  PTA pt living alone in single story home with level entry. Pt reports independence in ambulation community distances and independence with ADLs/iADLs. Later pt does endorse multiple falls which he attributes to his vertigo. Pt is currently limited in safe mobility by decreased endurance and balance. Pt is mod I for bed mobility,  supervision for transfers and min guard for ambulation with RW. MD in room during Evaluation and pt reports he might be able to stay with his brother. PT recommends this due to decreased endurance (likely from COVID) and instability. PT recommending HHPT and RW at discharge. PT will continue to follow acutely and refer to Mobility Team.       Recommendations for follow up therapy are one component of a multi-disciplinary discharge planning process, led by the attending physician.  Recommendations may be updated based on patient status, additional functional criteria and insurance authorization.  Follow Up Recommendations Home health PT      Assistance Recommended at Discharge Frequent or constant Supervision/Assistance  Patient can return home with the following  Assistance with cooking/housework;Assist for transportation;Help with stairs or ramp for entrance;A little help with walking and/or transfers;A little help with bathing/dressing/bathroom    Equipment Recommendations Rolling walker (2 wheels)     Functional Status Assessment Patient has had a recent decline in their functional status and demonstrates the ability to make significant  improvements in function in a reasonable and predictable amount of time.     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    Ambulation/Gait Ambulation/Gait assistance: Min guard Gait Distance (Feet): 60 Feet Assistive device: Rolling walker (2 wheels) Gait Pattern/deviations: Step-through pattern, Decreased step length - right, Decreased step length - left, Shuffle, Trunk flexed Gait velocity: slowed Gait velocity interpretation: <1.8 ft/sec, indicate of risk for recurrent falls   General Gait Details: min guard for safety with mildly unsteady gait, no overt LoB unfamiliar with RW use, vc for proximity to RW, and upright posture      Balance Overall balance assessment: Mild deficits observed, not formally tested                                           Pertinent Vitals/Pain Pain Assessment Pain Assessment: No/denies pain    Home Living Family/patient expects to be discharged to:: Private residence Living Arrangements: Alone Available Help at Discharge: Family;Available PRN/intermittently  Type of Home: House Home Access: Level entry       Home Layout: One level Home Equipment: None      Prior Function Prior Level of Function : Independent/Modified Independent             Mobility Comments: ambulates without AD, recently laid off, drives ADLs  Comments: independent     Hand Dominance        Extremity/Trunk Assessment   Upper Extremity Assessment Upper Extremity Assessment: Generalized weakness    Lower Extremity Assessment Lower Extremity Assessment: Generalized weakness       Communication   Communication: HOH  Cognition Arousal/Alertness: Awake/alert Behavior During Therapy: WFL for tasks assessed/performed Overall Cognitive Status: Impaired/Different from baseline Area of Impairment: Orientation, Safety/judgement, Problem solving                 Orientation Level: Disoriented to, Place (not sure about transfer from [place] to [facility])       Safety/Judgement: Decreased awareness of deficits   Problem Solving: Requires verbal cues, Requires tactile cues General Comments: pt asks twice if he is in [place], has poor safety awareness, reports falls at home        General Comments General comments (skin integrity, edema, etc.): BP 118/66, SpO2 on RA with ambulation 92-96%O2, HR max noted 83bpm    Exercises     Assessment/Plan    PT Assessment Patient needs continued PT services  PT Problem List Decreased strength;Decreased activity tolerance;Decreased balance;Decreased mobility;Decreased knowledge of use of DME;Decreased safety awareness       PT Treatment Interventions DME instruction;Gait training;Functional mobility training;Therapeutic activities;Therapeutic exercise;Balance training;Cognitive remediation;Patient/family education    PT Goals (Current goals can be found in the Care Plan section)  Acute Rehab PT Goals Patient Stated Goal: feel less tired PT Goal Formulation: With patient Time For Goal Achievement: 11/16/22 Potential to Achieve Goals: Good    Frequency Min 3X/week        AM-PAC PT "6 Clicks" Mobility  Outcome Measure Help needed turning from your back to your side while in a flat bed without using bedrails?: None Help needed moving from lying on your back to sitting  on the side of a flat bed without using bedrails?: None Help needed moving to and from a bed to a chair (including a wheelchair)?: None Help needed standing up from a chair using your arms (e.g., wheelchair or bedside chair)?: None Help needed to walk in hospital room?: A Little Help needed climbing 3-5 steps with a railing? : A Little 6 Click Score: 22    End of Session   Activity Tolerance: Patient tolerated treatment well Patient left: in chair;with call bell/phone within reach;with chair alarm set;Other (comment) (MD in room) Nurse Communication: Mobility status PT Visit Diagnosis: Unsteadiness on feet (R26.81);Other abnormalities of gait and mobility (R26.89);Muscle weakness (generalized) (M62.81);History of falling (Z91.81)    Time: [id number] PT Time Calculation (min) (ACUTE ONLY): 25 min   Charges:   PT Evaluation $PT Eval Moderate Complexity: 1 Mod PT Treatments $Gait Training: 8-22 mins        [place] B. Migdalia Dk PT, DPT Acute Rehabilitation Services Please use secure chat or  Call Office [phone]   [place]ia 11/08/2022, 10:40 AM

## 2022-11-08 NOTE — Progress Notes (Signed)
AVS given and reviewed with pt and brother, Legrand Como, at bedside. TOC meds delivered to bedside. Medications discussed. RW delivered to bedside. All questions answered to satisfaction. Pt verbalized understanding of information given. Pt to be escorted off the unit with all belongings via wheelchair by staff member.

## 2022-11-08 NOTE — Discharge Summary (Addendum)
Physician Discharge Summary  Timothy Nguyen [id number] DOB: 1951/02/01 DOA: 11/06/2022  PCP: Timothy Labrum, MD  Admit date: 11/06/2022 Discharge date: 11/08/2022  Time spent: 40 minutes  Recommendations for Outpatient Follow-up:  Follow outpatient CBC/CMP  Follow with cardiology outpatient Isolate per covid protocol Chlorthalidone on hold at discharge, follow outpatient BP on coreg/lisinopril Follow renal function, consider renal follow up outpatient Encourage etoh cessatino  Encephalopathy attributed to covid/hospitalization seems improved - if continued issues outaptient, consider neuro eval, consider contribution of etoh  Watch PO intake as he returns home  Discharge Diagnoses:  Principal Problem:   NSTEMI (non-ST elevated myocardial infarction) ([name]) Active Problems:   Acute CHF ([place])   COVID-19 virus infection   Hypertension   Hyperlipidemia   Discharge Condition: stable  Diet recommendation: heart healthy  Filed Weights   11/06/22 1432 11/07/22 0429 11/08/22 0900  Weight: 74.8 kg 69.8 kg 69.5 kg    History of present illness:   Timothy Nguyen, Timothy Nguyen 72 y/o with h/o testicular cancer, HLD, HTN, back pain presented to [facility] 11/05/22 due to 2 hrs of SOB. He denied any chest pain, fever, chills, endorsed Timothy Nguyen minor cough. Denied loss of taste or smell. He has not had Covid vaccine. In the ED EKG with minor abnormalities w/o ST elevation. Troponnins 28 to 178 to 1,835-last on 11/06/22 at 02:35. Di-dimer was 417. Cmet with Cr 1.75, CBCD nl. Pro-BNP 3,315. CXR 11/05/22 with mild CHF. Per ED note cardiology at [facility] Nguyen contacted recommending heparin qtt and transfer. Timothy Nguyen called and accepted transfer.    ED Course: seen in ED at [facility] Nguyen stable. Troponina to 1,835, BNP to 3,315, CXR with mild CHF 11/05/22. Cardiology called -recommended heparin infusion and Hospitalist admit. Timothy Nguyen called when patient arrived  He was admitted for an elevated troponin in the setting of  Timothy Nguyen COVID 19 infection.  Cardiology was c/s and suspected due to covid infection, no plans for additional cardiac eval.  Referred for outpatient follow up.  Started on paxlovid for covid.  Plan for outpatient follow up.  Some unsteadiness with walking at discharge, plan to discharge to brother's and have home health follow.  Hospital Course:  Assessment and Plan:  NSTEMI (non-ST elevated myocardial infarction) Timothy Nguyen) Patient w/o chest pain but dramatic rise in Troponin: 28 to 178 to 1,835.  Now downtrending. Negative D dimer at OSH Echo without RWMA - EF 50-55%.  Grade 1 diastolic dysfunction. A1c 4.8, LDL 61 Appreciate cardiology assistance - they suspect this is related to COVID 19 infection, no plans for additional cardiac eval at this point.  Will place referral for outpatient.    Acute CHF Timothy Nguyen) Patient with no prior h/o CHF now with mild CHF on CXR 11/05/22 and elevated BNP. Acute CHF associated with NSTEMI Echo as above Hold further diuretics, appears euvolemic at this time Outpatient cards follow up   COVID-19 virus infection Fever to 100.4 on 2/20.  No O2 requirement.  CXR at outside hospital with mild pulm edema CXR today without acute abnormality - suspect abnormal CXR due to mild edema given quick resolution Complete course of paxlovid, renally dose   COVID-19 Labs  Recent Labs    11/08/22 0316  CRP 4.8*    No results found for: "SARSCOV2NAA"  Encephalopathy Very mild, today generally Timothy Nguyen&O, but occasionally needs prompting for location (he was transferred from Timothy Nguyen to here) Brother came to see him and thought he was just about back to his baseline Follow outpatient, suspect due  to covid, could be related to etoh as well - if not consistently improved, needs neuro follow up  Deconditioning Therapy recommending home health, suspect weak in setting of covid and hospitalization/time in bed Follow with home health outpatient  ETOH Abuse CIWA  Encourage  abstinence    Hyperlipidemia Hold crestor with plan for paxlovid above Resume next week   Hypertension Continue coreg Has lisinopril and chlorthalidone listed as home meds, these are currently on hold - at discharge, resume lisnopril (continue to hold chlorthalidone)    Procedures: Echo IMPRESSIONS     1. Left ventricular ejection fraction, by estimation, is 50 to 55%. The  left ventricle has low normal function. The left ventricle has no regional  wall motion abnormalities. There is mild concentric left ventricular  hypertrophy. Left ventricular  diastolic parameters are consistent with Grade I diastolic dysfunction  (impaired relaxation).   2. Right ventricular systolic function is hyperdynamic. The right  ventricular size is normal. There is normal pulmonary artery systolic  pressure.   3. Abnormal mitral valve thickening with diastolic doming (best seen in  A4C view) with abnormal leaflet motion. Consistent with Timothy Nguyen post  inflammatory sequelae. MVA by PHT 6.88 cm2. The mitral valve is abnormal.  No evidence of mitral valve regurgitation.  No evidence of mitral stenosis.   4. The aortic valve was not well visualized. Aortic valve regurgitation  is not visualized. No aortic stenosis is present.   Comparison(s): No prior Echocardiogram.    Consultations: cardiology  Discharge Exam: Vitals:   11/08/22 0846 11/08/22 1000  BP: (!) 141/98   Pulse: 93 (!) 110  Resp: 16   Temp: 98 F (36.7 C)   SpO2: 97%    No complaints Feels up to discharge [name]&O, but occasionally some confusion.  Discussed with brother once he got here, he thinks overall he's improved and is close to his baseline.  General: No acute distress. Cardiovascular: Heart sounds show [name] regular rate, and rhythm. Lungs: Clear to auscultation bilaterally  Abdomen: Soft, nontender, nondistended  Neurological: Alert and oriented 3. Moves all extremities 4. Cranial nerves II through XII grossly  intact. Extremities: No clubbing or cyanosis. No edema.   Discharge Instructions   Discharge Instructions     (HEART FAILURE PATIENTS) Call MD:  Anytime you have any of the following symptoms: 1) 3 pound weight gain in 24 hours or 5 pounds in 1 week 2) shortness of breath, with or without Mikaylah Libbey dry hacking cough 3) swelling in the hands, feet or stomach 4) if you have to sleep on extra pillows at night in order to breathe.   Complete by: As directed    Ambulatory referral to Cardiology   Complete by: As directed    Call MD for:  difficulty breathing, headache or visual disturbances   Complete by: As directed    Call MD for:  extreme fatigue   Complete by: As directed    Call MD for:  hives   Complete by: As directed    Call MD for:  persistant dizziness or light-headedness   Complete by: As directed    Call MD for:  persistant nausea and vomiting   Complete by: As directed    Call MD for:  redness, tenderness, or signs of infection (pain, swelling, redness, odor or green/yellow discharge around incision site)   Complete by: As directed    Call MD for:  severe uncontrolled pain   Complete by: As directed    Call MD for:  temperature >100.4   Complete by: As directed    Diet - low sodium heart healthy   Complete by: As directed    Discharge instructions   Complete by: As directed    You were seen with Daniah Zaldivar COVID 19 infection.  You also had elevated heart enzymes which were likely related to the infection.    You had an ultrasound that showed Elbert Spickler low normal ejection fraction.  We've started you on aspirin and coreg.  You'll continue crestor, but start this next week (after you've completed the paxlovid).  You'll need cardiology follow up outpatient.  For your covid, we'll send you home with paxlovid.  You'll complete the 5 day course (we're going to give you Ruberta Holck pack from the hospital to complete).  You should isolate for 10 days from your positive test (until March 1st).    I've stopped your  chlorthalidone.  Continue your lisinopril for now.  Follow with your PCP outpatient within Herschel Fleagle week for repeat labs to make sure your kidney function is stable.  You had mild confusion, I think this was related to your covid 19 infection.  It seems to be improving.  If you don't see continued improvement, he should return to care or see his PCP (consider outpatient neurology follow up).  You're unsteady on your feet, I think this is related to covid and your hospitalization and being in bed.  I recommend home health PT.  We'll send you with Byrd Terrero walker.  I'd expect this to gradually improve with more activity at home and as you recover from covid.  I'd encourage alcohol cessation.  Continued heavy use can cause issues with cognition as well as physical abilities like walking, etc.  Follow up with your PCP for resources.  Your kidney function appears abnormal at baseline.  Continue to follow with your PCP outpatient to follow your labs.  Avoid NSAIDs and over the counter meds unless you discuss these with your PCP.  Return for new, recurrent, or worsening symptoms.  Please ask your PCP to request records from this hospitalization so they know what was done and what the next steps will be.   Increase activity slowly   Complete by: As directed       Allergies as of 11/08/2022   No Known Allergies      Medication List     STOP taking these medications    chlorthalidone 25 MG tablet Commonly known as: HYGROTON       TAKE these medications    aspirin EC 81 MG tablet Take 1 tablet (81 mg total) by mouth daily. Swallow whole. Start taking on: November 09, 2022   carvedilol 3.125 MG tablet Commonly known as: COREG Take 1 tablet (3.125 mg total) by mouth 2 (two) times daily with Dashia Caldeira meal.   famotidine 20 MG tablet Commonly known as: PEPCID Take 20 mg by mouth 2 (two) times daily.   gabapentin 300 MG capsule Commonly known as: NEURONTIN Take 600 mg by mouth 2 (two) times daily.    lisinopril 20 MG tablet Commonly known as: ZESTRIL Take 20 mg by mouth 2 (two) times daily.   nirmatrelvir/ritonavir (renal dosing) 10 x 150 MG & 10 x 100MG Tabs Commonly known as: PAXLOVID Take 2 tablets by mouth 2 (two) times daily for 5 days. Patient GFR is 42. Take nirmatrelvir (150 mg) one tablet twice daily for 5 days and ritonavir (100 mg) one tablet twice daily for 5 days.   OMEGA 3 500 PO  Take by mouth daily.   rosuvastatin 5 MG tablet Commonly known as: CRESTOR Take 1 tablet (5 mg total) by mouth daily. Resume this in 1 week (only after completing the paxlovid). Start taking on: November 15, 2022 What changed:  additional instructions These instructions start on November 15, 2022. If you are unsure what to do until then, ask your doctor or other care provider.               Durable Medical Equipment  (From admission, onward)           Start     Ordered   11/08/22 1200  For home use only DME Walker rolling  Once       Question Answer Comment  Walker: With 5 Inch Wheels   Patient needs Braiden Rodman walker to treat with the following condition General weakness      11/08/22 1159           No Known Allergies  Follow-up Information     [facility] Follow up.   Why: Rolling walker to be delivered to the room Contact information: [address] [place] [address] [phone]         Timothy Labrum, MD Follow up.   Specialty: Family Medicine Contact information: [place] [address] [phone]                  The results of significant diagnostics from this hospitalization (including imaging, microbiology, ancillary and laboratory) are listed below for reference.    Significant Diagnostic Studies: DG CHEST PORT 1 VIEW  Result Date: 11/07/2022 CLINICAL DATA:  Edema EXAM: PORTABLE CHEST 1 VIEW COMPARISON:  Portable exam 0928 hours compared to 11/06/2022 FINDINGS: Normal heart size, mediastinal contours, and  pulmonary vascularity. Lungs clear. No infiltrate, pleural effusion, or pneumothorax. Pulmonary edema seen on previous exam resolved. Osseous structures unremarkable. IMPRESSION: No acute abnormalities. Resolution of mild pulmonary edema seen on prior study. Electronically Signed   By: Lavonia Dana M.D.   On: 11/07/2022 10:27   ECHOCARDIOGRAM COMPLETE  Result Date: 11/06/2022    ECHOCARDIOGRAM REPORT   Patient Name:   DEQUANE GOBEIL Date of Exam: 11/06/2022 Medical Rec #:  [id number]       Height:       66.0 in Accession #:    [id number]      Weight:       165.0 lb Date of Birth:  1950-11-07       BSA:          1.843 m Patient Age:    71 years        BP:           114/64 mmHg Patient Gender: M               HR:           110 bpm. Exam Location:  Inpatient Procedure: 2D Echo, Cardiac Doppler and Color Doppler STAT ECHO Indications:    NSTEMI  History:        Patient has no prior history of Echocardiogram examinations.                 CHF, Acute MI; Risk Factors:Hypertension and Dyslipidemia. COVID                 +.  Sonographer:    Wenda Low Referring Phys: [place]  1. Left ventricular ejection fraction, by estimation, is 50  to 55%. The left ventricle has low normal function. The left ventricle has no regional wall motion abnormalities. There is mild concentric left ventricular hypertrophy. Left ventricular diastolic parameters are consistent with Grade I diastolic dysfunction (impaired relaxation).  2. Right ventricular systolic function is hyperdynamic. The right ventricular size is normal. There is normal pulmonary artery systolic pressure.  3. Abnormal mitral valve thickening with diastolic doming (best seen in A4C view) with abnormal leaflet motion. Consistent with Tamerra Merkley post inflammatory sequelae. MVA by PHT 6.88 cm2. The mitral valve is abnormal. No evidence of mitral valve regurgitation. No evidence of mitral stenosis.  4. The aortic valve was not well visualized. Aortic valve  regurgitation is not visualized. No aortic stenosis is present. Comparison(s): No prior Echocardiogram. FINDINGS  Left Ventricle: Left ventricular ejection fraction, by estimation, is 50 to 55%. The left ventricle has low normal function. The left ventricle has no regional wall motion abnormalities. The left ventricular internal cavity size was normal in size. There is mild concentric left ventricular hypertrophy. Left ventricular diastolic parameters are consistent with Grade I diastolic dysfunction (impaired relaxation). Right Ventricle: The right ventricular size is normal. No increase in right ventricular wall thickness. Right ventricular systolic function is hyperdynamic. There is normal pulmonary artery systolic pressure. The tricuspid regurgitant velocity is 2.73 m/s, and with an assumed right atrial pressure of 3 mmHg, the estimated right ventricular systolic pressure is 0000000 mmHg. Left Atrium: Left atrial size was normal in size. Right Atrium: Right atrial size was normal in size. Pericardium: There is no evidence of pericardial effusion. Mitral Valve: Abnormal mitral valve thickening with diastolic doming (best seen in A4C view) with abnormal leaflet motion. Consistent with Kevionna Heffler post inflammatory sequelae. MVA by PHT 6.88 cm2. The mitral valve is abnormal. No evidence of mitral valve regurgitation. No evidence of mitral valve stenosis. MV peak gradient, 5.5 mmHg. The mean mitral valve gradient is 2.0 mmHg. Tricuspid Valve: The tricuspid valve is normal in structure. Tricuspid valve regurgitation is not demonstrated. No evidence of tricuspid stenosis. Aortic Valve: The aortic valve was not well visualized. Aortic valve regurgitation is not visualized. No aortic stenosis is present. Aortic valve mean gradient measures 3.0 mmHg. Aortic valve peak gradient measures 7.2 mmHg. Aortic valve area, by VTI measures 2.37 cm. Pulmonic Valve: The pulmonic valve was normal in structure. Pulmonic valve regurgitation is not  visualized. No evidence of pulmonic stenosis. Aorta: The aortic root is normal in size and structure. IAS/Shunts: No atrial level shunt detected by color flow Doppler.  LEFT VENTRICLE PLAX 2D LVIDd:         5.00 cm   Diastology LVIDs:         3.70 cm   LV e' medial:    5.87 cm/s LV PW:         1.30 cm   LV E/e' medial:  8.8 LV IVS:        1.20 cm   LV e' lateral:   7.72 cm/s LVOT diam:     1.90 cm   LV E/e' lateral: 6.7 LV SV:         58 LV SV Index:   31 LVOT Area:     2.84 cm  RIGHT VENTRICLE RV Basal diam:  3.15 cm RV Mid diam:    2.60 cm RV S prime:     22.90 cm/s TAPSE (M-mode): 2.7 cm LEFT ATRIUM             Index  RIGHT ATRIUM           Index LA diam:        4.00 cm 2.17 cm/m   RA Area:     17.90 cm LA Vol (A2C):   46.9 ml 25.45 ml/m  RA Volume:   47.50 ml  25.78 ml/m LA Vol (A4C):   38.1 ml 20.67 ml/m LA Biplane Vol: 42.6 ml 23.12 ml/m  AORTIC VALVE                    PULMONIC VALVE AV Area (Vmax):    2.37 cm     PV Vmax:       0.87 m/s AV Area (Vmean):   2.66 cm     PV Peak grad:  3.0 mmHg AV Area (VTI):     2.37 cm AV Vmax:           134.00 cm/s AV Vmean:          82.500 cm/s AV VTI:            0.243 m AV Peak Grad:      7.2 mmHg AV Mean Grad:      3.0 mmHg LVOT Vmax:         112.00 cm/s LVOT Vmean:        77.300 cm/s LVOT VTI:          0.203 m LVOT/AV VTI ratio: 0.84  AORTA Ao Root diam: 3.40 cm MITRAL VALVE               TRICUSPID VALVE MV Area (PHT): 6.77 cm    TR Peak grad:   29.8 mmHg MV Area VTI:   3.08 cm    TR Vmax:        273.00 cm/s MV Peak grad:  5.5 mmHg MV Mean grad:  2.0 mmHg    SHUNTS MV Vmax:       1.17 m/s    Systemic VTI:  0.20 m MV Vmean:      60.8 cm/s   Systemic Diam: 1.90 cm MV Decel Time: 112 msec MV E velocity: 51.80 cm/s MV Shanikia Kernodle velocity: 92.10 cm/s MV E/Estella Malatesta ratio:  0.56 Rudean Haskell MD Electronically signed by Rudean Haskell MD Signature Date/Time: 11/06/2022/4:51:32 PM    Final     Microbiology: No results found for this or any previous visit (from  the past 240 hour(s)).   Labs: Basic Metabolic Panel: Recent Labs  Lab 11/07/22 0335 11/08/22 0316  NA 132* 132*  K 4.0 3.8  CL 98 97*  CO2 25 25  GLUCOSE 104* 98  BUN 19 20  CREATININE 1.75* 1.70*  CALCIUM 9.0 9.4  MG  --  2.2  PHOS  --  3.6   Liver Function Tests: Recent Labs  Lab 11/08/22 0316  AST 40  ALT 22  ALKPHOS 40  BILITOT 0.6  PROT 6.2*  ALBUMIN 3.2*   No results for input(s): "LIPASE", "AMYLASE" in the last 168 hours. No results for input(s): "AMMONIA" in the last 168 hours. CBC: Recent Labs  Lab 11/08/22 0316  WBC 3.5*  NEUTROABS 1.9  HGB 12.2*  HCT 36.0*  MCV 90.5  PLT 193   Cardiac Enzymes: No results for input(s): "CKTOTAL", "CKMB", "CKMBINDEX", "TROPONINI" in the last 168 hours. BNP: BNP (last 3 results) Recent Labs    11/07/22 0335  BNP 270.2*    ProBNP (last 3 results) No results for input(s): "PROBNP" in the last 8760 hours.  CBG: No results  for input(s): "GLUCAP" in the last 168 hours.     Signed:  Fayrene Helper MD.  [facility] 11/08/2022, 12:45 PM

## 2022-11-08 NOTE — TOC Initial Note (Addendum)
Transition of Care [facility]) - Initial/Assessment Note    Patient Details  Name: Timothy Nguyen MRN: [id number] Date of Birth: March 02, 1951  [facility]) CM/SW Contact:    Bethena Roys, RN Phone Number: 11/08/2022, 12:09 PM  Clinical Narrative: Patient presented as a transfer from [name] then to [facility]. Patient found to have COVID. Plan for home today. PT/OT has consulted the patient and recommendations for home health. Case Manager dicussed home health options with the patient and the patient declined home health services. Patient reports that the brother is his support person post hospitalization and patient is aware to call his PCP if he needs further home health services in the future. Rolling walker ordered and will be delivered via [employer]. No further needs identified at this time.       11-08-22 1307 Patient is now agreeable with home health services- Patient will return to his brothers home [address] [place] [address]. Patient's cell is [phone]. Latricia Heft is willing to accept the patient for [facility] PT services and start of care to begin within 24-48 hours post transition home. No further needs identified at this time.               Expected Discharge Plan: Home/Self Care Barriers to Discharge: No Barriers Identified   Patient Goals and CMS Choice Patient states their goals for this hospitalization and ongoing recovery are:: to return home   Choice offered to / list presented to : NA  Expected Discharge Plan and Services In-house Referral: NA Discharge Planning Services: CM Consult Post Acute Care Choice: Durable Medical Equipment Living arrangements for the past 2 months: Single Family Home                   DME Agency: NA     HH Arranged: Patient Refused HH   Prior Living Arrangements/Services Living arrangements for the past 2 months: Single Family Home Lives with:: Self Patient language and need for interpreter reviewed:: Yes Do you feel safe  going back to the place where you live?: Yes      Need for Family Participation in Patient Care: Yes (Comment) Care giver support system in place?: Yes (comment)   Criminal Activity/Legal Involvement Pertinent to Current Situation/Hospitalization: No - Comment as needed   Permission Sought/Granted Permission sought to share information with : Family Supports, Case Manager    Emotional Assessment Appearance:: Appears stated age Attitude/Demeanor/Rapport: Engaged Affect (typically observed): Appropriate Orientation: : Oriented to Situation, Oriented to  Time, Oriented to Place, Oriented to Self Alcohol / Substance Use: Not Applicable Psych Involvement: No (comment)  Admission diagnosis:  NSTEMI (non-ST elevated myocardial infarction) [facility]) [I21.4] Patient Active Problem List   Diagnosis Date Noted   COVID-19 virus infection 11/06/2022   NSTEMI (non-ST elevated myocardial infarction) ([place]) 11/06/2022   Acute CHF ([place]) 11/06/2022   Hypertension 08/01/2020   Hyperlipidemia 08/01/2020   Dysphagia 06/18/2018   PCP:  Curlene Labrum, MD Pharmacy:   [place], [place] [address] Phone: [phone] Fax: [phone] Social Determinants of Health (SDOH) Social History: SDOH Screenings   Food Insecurity: Unknown (11/07/2022)  Housing: Low Risk  (11/07/2022)  Transportation Needs: No Transportation Needs (11/07/2022)  Utilities: Not At Risk (11/07/2022)  Tobacco Use: High Risk (11/06/2022)   SDOH Interventions: Housing Interventions: Patient Refused  Readmission Risk Interventions     No data to display

## 2022-11-21 DIAGNOSIS — G9340 Encephalopathy, unspecified: Secondary | ICD-10-CM | POA: Diagnosis not present

## 2022-11-21 DIAGNOSIS — N1832 Chronic kidney disease, stage 3b: Secondary | ICD-10-CM | POA: Diagnosis not present

## 2022-11-21 DIAGNOSIS — I214 Non-ST elevation (NSTEMI) myocardial infarction: Secondary | ICD-10-CM | POA: Diagnosis not present

## 2022-11-21 DIAGNOSIS — I700 Atherosclerosis of aorta: Secondary | ICD-10-CM | POA: Diagnosis not present

## 2022-11-21 DIAGNOSIS — I77810 Thoracic aortic ectasia: Secondary | ICD-10-CM | POA: Diagnosis not present

## 2022-11-21 DIAGNOSIS — F1010 Alcohol abuse, uncomplicated: Secondary | ICD-10-CM | POA: Diagnosis not present

## 2022-11-21 DIAGNOSIS — I10 Essential (primary) hypertension: Secondary | ICD-10-CM | POA: Diagnosis not present

## 2022-11-23 ENCOUNTER — Ambulatory Visit: Payer: Medicare HMO | Attending: Nurse Practitioner | Admitting: Nurse Practitioner

## 2022-11-26 ENCOUNTER — Encounter: Payer: Self-pay | Admitting: Nurse Practitioner

## 2022-12-05 ENCOUNTER — Other Ambulatory Visit (HOSPITAL_COMMUNITY): Payer: Self-pay

## 2022-12-11 ENCOUNTER — Ambulatory Visit: Payer: Medicare HMO | Attending: Nurse Practitioner | Admitting: Nurse Practitioner

## 2022-12-11 ENCOUNTER — Encounter: Payer: Self-pay | Admitting: Nurse Practitioner

## 2022-12-11 VITALS — BP 150/100 | HR 94 | Ht 66.0 in | Wt 156.2 lb

## 2022-12-11 DIAGNOSIS — Z79899 Other long term (current) drug therapy: Secondary | ICD-10-CM | POA: Diagnosis not present

## 2022-12-11 DIAGNOSIS — E785 Hyperlipidemia, unspecified: Secondary | ICD-10-CM | POA: Diagnosis not present

## 2022-12-11 DIAGNOSIS — I10 Essential (primary) hypertension: Secondary | ICD-10-CM | POA: Diagnosis not present

## 2022-12-11 DIAGNOSIS — U071 COVID-19: Secondary | ICD-10-CM | POA: Diagnosis not present

## 2022-12-11 DIAGNOSIS — I700 Atherosclerosis of aorta: Secondary | ICD-10-CM | POA: Diagnosis not present

## 2022-12-11 DIAGNOSIS — F1010 Alcohol abuse, uncomplicated: Secondary | ICD-10-CM | POA: Diagnosis not present

## 2022-12-11 DIAGNOSIS — N1832 Chronic kidney disease, stage 3b: Secondary | ICD-10-CM | POA: Diagnosis not present

## 2022-12-11 DIAGNOSIS — I252 Old myocardial infarction: Secondary | ICD-10-CM | POA: Diagnosis not present

## 2022-12-11 DIAGNOSIS — G9340 Encephalopathy, unspecified: Secondary | ICD-10-CM | POA: Diagnosis not present

## 2022-12-11 DIAGNOSIS — I214 Non-ST elevation (NSTEMI) myocardial infarction: Secondary | ICD-10-CM | POA: Diagnosis not present

## 2022-12-11 DIAGNOSIS — I77810 Thoracic aortic ectasia: Secondary | ICD-10-CM | POA: Diagnosis not present

## 2022-12-11 NOTE — Patient Instructions (Signed)
Medication Instructions:  Your physician recommends that you continue on your current medications as directed. Please refer to the Current Medication list given to you today.   Labwork: CBC, CMET due in 1-2 weeks at [facility]  Testing/Procedures: none  Follow-Up:  Your physician recommends that you schedule a follow-up appointment in: 6 months  Any Other Special Instructions Will Be Listed Below (If Applicable).  If you need a refill on your cardiac medications before your next appointment, please call your pharmacy.

## 2022-12-11 NOTE — Progress Notes (Signed)
Office Visit    Patient Name: Timothy Nguyen Date of Encounter: 12/11/2022  PCP:  Curlene Labrum, [place]  Cardiologist:  Chalmers Guest, MD  Advanced Practice Provider:  Finis Bud, NP Electrophysiologist:  None   Chief Complaint    Timothy Nguyen is a 72 y.o. male with a hx of hyperlipidemia, hypertension, GERD, and history of prostate/testicular cancer, EtOH abuse, history of COVID-19, history of NSTEMI, who presents today for hospital follow-up.    Past Medical History    Past Medical History:  Diagnosis Date   Arthritis    [facility]cer [facility])    prostate, testicular   GERD (gastroesophageal reflux disease)    Hyperlipemia    Hypertension    Past Surgical History:  Procedure Laterality Date   BACK SURGERY     COLONOSCOPY  01/21/2012   Procedure: COLONOSCOPY;  Surgeon: Danie Binder, MD;  Location: [facility];  Service: Endoscopy;  Laterality: N/A;  11:15 AM   ORCHIECTOMY     PROSTATECTOMY      Allergies  No Known Allergies  History of Present Illness    Timothy Nguyen is a 72 y.o. male with a PMH as mentioned above.  Last seen by Levell July, NP on August 01, 2021.  Patient was doing well at the time.  Was very active at the time.   Presented to [facility] in February 2024 with shortness of breath, started at rest.  Tried taking Xanax due to increased stress and anxiety, did not help.  Initial troponin 28, 2-hour repeat was 178, >> 1,835.  D-dimer 417.  proBNP 3315.  Tested positive for COVID, D-dimer negative.  Was transferred to [facility] for further evaluation.  EKG revealed sinus tach, premature atrial complexes, nonspecific ST abnormality.  Chest x-ray showed mild CHF.  Cardiology consulted, no plan for additional cardiac eval.  Referred for outpatient follow-up.  TTE showed normal EF, grade 1 DD.  Hospital course complicated by very mild encephalopathy, suspected to be related to COVID-19 and could be  related to EtOH as well.  Today he presents for hospital follow-up.  He states he is doing well from a cardiac perspective. Continues to remain active. Denies any chest pain, shortness of breath, palpitations, syncope, presyncope, dizziness, orthopnea, PND, swelling or significant weight changes, acute bleeding, or claudication. Working on quitting alcohol.   EKGs/Labs/Other Studies Reviewed:   The following studies were reviewed today:   EKG:  EKG is not ordered today.    TTE 10/2022: 1. Left ventricular ejection fraction, by estimation, is 50 to 55%. The  left ventricle has low normal function. The left ventricle has no regional  wall motion abnormalities. There is mild concentric left ventricular  hypertrophy. Left ventricular  diastolic parameters are consistent with Grade I diastolic dysfunction  (impaired relaxation).   2. Right ventricular systolic function is hyperdynamic. The right  ventricular size is normal. There is normal pulmonary artery systolic  pressure.   3. Abnormal mitral valve thickening with diastolic doming (best seen in  A4C view) with abnormal leaflet motion. Consistent with a post  inflammatory sequelae. MVA by PHT 6.88 cm2. The mitral valve is abnormal.  No evidence of mitral valve regurgitation.  No evidence of mitral stenosis.   4. The aortic valve was not well visualized. Aortic valve regurgitation  is not visualized. No aortic stenosis is present.   Comparison(s): No prior Echocardiogram.  Recent Labs: 11/07/2022: B Natriuretic Peptide  270.2 11/08/2022: ALT 22; BUN 20; Creatinine, Ser 1.70; Hemoglobin 12.2; Magnesium 2.2; Platelets 193; Potassium 3.8; Sodium 132  Recent Lipid Panel    Component Value Date/Time   CHOL 124 11/08/2022 0316   TRIG 102 11/08/2022 0316   HDL 43 11/08/2022 0316   CHOLHDL 2.9 11/08/2022 0316   VLDL 20 11/08/2022 0316   LDLCALC 61 11/08/2022 0316     Home Medications   Current Meds  Medication Sig   aspirin EC 81  MG tablet Take 1 tablet (81 mg total) by mouth daily. Swallow whole.   carvedilol (COREG) 3.125 MG tablet Take 1 tablet (3.125 mg total) by mouth 2 (two) times daily with a meal.   famotidine (PEPCID) 20 MG tablet Take 20 mg by mouth as needed.   lisinopril (PRINIVIL,ZESTRIL) 20 MG tablet Take 20 mg by mouth 2 (two) times daily.    Omega-3 Fatty Acids (OMEGA 3 500 PO) Take by mouth daily.    rosuvastatin (CRESTOR) 5 MG tablet Take 1 tablet (5 mg total) by mouth daily. Resume this in 1 week (only after completing the paxlovid).     Review of Systems    All other systems reviewed and are otherwise negative except as noted above.  Physical Exam    VS:  BP (!) 150/100 (BP Location: Right Arm, Patient Position: Sitting, Cuff Size: Normal)   Pulse 94   Ht 5\' 6"  (1.676 m)   Wt 156 lb 3.2 oz (70.9 kg)   SpO2 97%   BMI 25.21 kg/m  , BMI Body mass index is 25.21 kg/m.  Wt Readings from Last 3 Encounters:  12/11/22 156 lb 3.2 oz (70.9 kg)  11/08/22 153 lb 3.5 oz (69.5 kg)  08/01/21 158 lb 3.2 oz (71.8 kg)     GEN: Well nourished, well developed, in no acute distress. HEENT: normal. Neck: Supple, no JVD, carotid bruits, or masses. Cardiac: S1/S2, RRR, no murmurs, rubs, or gallops. No clubbing, cyanosis, edema.  Radials/PT 2+ and equal bilaterally.  Respiratory:  Respirations regular and unlabored, clear to auscultation bilaterally. MS: No deformity or atrophy. Skin: Warm and dry, no rash. Neuro:  Strength and sensation are intact. Psych: Normal affect.  Assessment & Plan    History of NSTEMI In setting of Covid-19. Stable with no anginal symptoms. No indication for ischemic evaluation. Continue Aspirn, coreg, lisinopril, and rosuvastatin. Will obtain upcoming labs including: CBC and CMET. Heart healthy diet and regular cardiovascular exercise encouraged.   HTN BP on arrival, 150/100. Repeat BP by me 141/81. Does admit to [facility]. BP well controlled at home. Given BP log and discussed to  monitor BP at home at least 2 hours after medications and sitting for 5-10 minutes. Discussed when to notify office, and he verbalizes understanding. No medication changes at this time. At next OV, if BP not well controlled, recommend increasing lisinopril. Obtaining labs as previously mentioned. Heart healthy diet and regular cardiovascular exercise encouraged.   HLD Continue rosuvastatin. Continue to follow with PCP. Heart healthy diet and regular cardiovascular exercise encouraged.   ETOH abuse Encouraged patient to wean off alcohol, verbalizes understanding. Continue to follow with PCP. Heart healthy diet and regular cardiovascular exercise encouraged.   Disposition: Previous patient of Dr. Bronson Ing. Will establish patient with Dr. Dellia Cloud. Follow up in 6 month(s) with Vishnu P Mallipeddi, MD or APP.  Signed, Finis Bud, NP 12/14/2022, 8:36 AM [place]

## 2022-12-31 DIAGNOSIS — Z79899 Other long term (current) drug therapy: Secondary | ICD-10-CM | POA: Diagnosis not present

## 2023-01-23 DIAGNOSIS — F17210 Nicotine dependence, cigarettes, uncomplicated: Secondary | ICD-10-CM | POA: Diagnosis not present

## 2023-01-23 DIAGNOSIS — R030 Elevated blood-pressure reading, without diagnosis of hypertension: Secondary | ICD-10-CM | POA: Diagnosis not present

## 2023-01-23 DIAGNOSIS — Z6825 Body mass index (BMI) 25.0-25.9, adult: Secondary | ICD-10-CM | POA: Diagnosis not present

## 2023-01-23 DIAGNOSIS — T148XXA Other injury of unspecified body region, initial encounter: Secondary | ICD-10-CM | POA: Diagnosis not present

## 2023-01-23 DIAGNOSIS — F1010 Alcohol abuse, uncomplicated: Secondary | ICD-10-CM | POA: Diagnosis not present

## 2023-01-23 DIAGNOSIS — N1832 Chronic kidney disease, stage 3b: Secondary | ICD-10-CM | POA: Diagnosis not present

## 2023-01-23 DIAGNOSIS — E875 Hyperkalemia: Secondary | ICD-10-CM | POA: Diagnosis not present

## 2023-02-20 DIAGNOSIS — F17210 Nicotine dependence, cigarettes, uncomplicated: Secondary | ICD-10-CM | POA: Diagnosis not present

## 2023-02-20 DIAGNOSIS — A084 Viral intestinal infection, unspecified: Secondary | ICD-10-CM | POA: Diagnosis not present

## 2023-02-20 DIAGNOSIS — Z6823 Body mass index (BMI) 23.0-23.9, adult: Secondary | ICD-10-CM | POA: Diagnosis not present

## 2023-02-20 DIAGNOSIS — R030 Elevated blood-pressure reading, without diagnosis of hypertension: Secondary | ICD-10-CM | POA: Diagnosis not present

## 2023-03-23 DIAGNOSIS — I252 Old myocardial infarction: Secondary | ICD-10-CM | POA: Diagnosis not present

## 2023-03-23 DIAGNOSIS — E785 Hyperlipidemia, unspecified: Secondary | ICD-10-CM | POA: Diagnosis not present

## 2023-03-23 DIAGNOSIS — K219 Gastro-esophageal reflux disease without esophagitis: Secondary | ICD-10-CM | POA: Diagnosis not present

## 2023-03-23 DIAGNOSIS — R0602 Shortness of breath: Secondary | ICD-10-CM | POA: Diagnosis not present

## 2023-03-23 DIAGNOSIS — F17210 Nicotine dependence, cigarettes, uncomplicated: Secondary | ICD-10-CM | POA: Diagnosis not present

## 2023-03-23 DIAGNOSIS — R0600 Dyspnea, unspecified: Secondary | ICD-10-CM | POA: Diagnosis not present

## 2023-03-23 DIAGNOSIS — I10 Essential (primary) hypertension: Secondary | ICD-10-CM | POA: Diagnosis not present

## 2023-03-23 DIAGNOSIS — R42 Dizziness and giddiness: Secondary | ICD-10-CM | POA: Diagnosis not present

## 2023-03-23 DIAGNOSIS — Z7982 Long term (current) use of aspirin: Secondary | ICD-10-CM | POA: Diagnosis not present

## 2023-03-23 DIAGNOSIS — Z79899 Other long term (current) drug therapy: Secondary | ICD-10-CM | POA: Diagnosis not present

## 2023-04-17 DIAGNOSIS — I10 Essential (primary) hypertension: Secondary | ICD-10-CM | POA: Diagnosis not present

## 2023-04-17 DIAGNOSIS — E782 Mixed hyperlipidemia: Secondary | ICD-10-CM | POA: Diagnosis not present

## 2023-04-17 DIAGNOSIS — I2510 Atherosclerotic heart disease of native coronary artery without angina pectoris: Secondary | ICD-10-CM | POA: Diagnosis not present

## 2023-04-17 DIAGNOSIS — K219 Gastro-esophageal reflux disease without esophagitis: Secondary | ICD-10-CM | POA: Diagnosis not present

## 2023-05-31 ENCOUNTER — Ambulatory Visit: Payer: Medicare HMO | Admitting: Internal Medicine

## 2023-05-31 NOTE — Progress Notes (Signed)
Erroneous encounter - please disregard.

## 2023-06-12 DIAGNOSIS — I10 Essential (primary) hypertension: Secondary | ICD-10-CM | POA: Diagnosis not present

## 2023-06-12 DIAGNOSIS — Z8042 Family history of malignant neoplasm of prostate: Secondary | ICD-10-CM | POA: Diagnosis not present

## 2023-06-12 DIAGNOSIS — F1010 Alcohol abuse, uncomplicated: Secondary | ICD-10-CM | POA: Diagnosis not present

## 2023-06-12 DIAGNOSIS — E875 Hyperkalemia: Secondary | ICD-10-CM | POA: Diagnosis not present

## 2023-06-12 DIAGNOSIS — I77810 Thoracic aortic ectasia: Secondary | ICD-10-CM | POA: Diagnosis not present

## 2023-06-12 DIAGNOSIS — N1832 Chronic kidney disease, stage 3b: Secondary | ICD-10-CM | POA: Diagnosis not present

## 2023-06-12 DIAGNOSIS — I700 Atherosclerosis of aorta: Secondary | ICD-10-CM | POA: Diagnosis not present

## 2023-06-12 DIAGNOSIS — Z6823 Body mass index (BMI) 23.0-23.9, adult: Secondary | ICD-10-CM | POA: Diagnosis not present

## 2023-06-17 ENCOUNTER — Encounter: Payer: Medicare HMO | Admitting: Internal Medicine

## 2023-06-17 NOTE — Progress Notes (Signed)
Erroneous encounter - please disregard.

## 2023-08-07 DIAGNOSIS — R944 Abnormal results of kidney function studies: Secondary | ICD-10-CM | POA: Diagnosis not present

## 2023-08-07 DIAGNOSIS — E782 Mixed hyperlipidemia: Secondary | ICD-10-CM | POA: Diagnosis not present

## 2023-08-07 DIAGNOSIS — I10 Essential (primary) hypertension: Secondary | ICD-10-CM | POA: Diagnosis not present

## 2023-08-07 DIAGNOSIS — Z1329 Encounter for screening for other suspected endocrine disorder: Secondary | ICD-10-CM | POA: Diagnosis not present

## 2023-08-07 DIAGNOSIS — E7849 Other hyperlipidemia: Secondary | ICD-10-CM | POA: Diagnosis not present

## 2023-08-07 DIAGNOSIS — F1010 Alcohol abuse, uncomplicated: Secondary | ICD-10-CM | POA: Diagnosis not present

## 2024-03-14 DIAGNOSIS — R0689 Other abnormalities of breathing: Secondary | ICD-10-CM | POA: Diagnosis not present

## 2024-03-14 DIAGNOSIS — I10 Essential (primary) hypertension: Secondary | ICD-10-CM | POA: Diagnosis not present

## 2024-03-14 DIAGNOSIS — Z79899 Other long term (current) drug therapy: Secondary | ICD-10-CM | POA: Diagnosis not present

## 2024-03-14 DIAGNOSIS — Z7982 Long term (current) use of aspirin: Secondary | ICD-10-CM | POA: Diagnosis not present

## 2024-03-14 DIAGNOSIS — F17210 Nicotine dependence, cigarettes, uncomplicated: Secondary | ICD-10-CM | POA: Diagnosis not present

## 2024-03-14 DIAGNOSIS — Z8546 Personal history of malignant neoplasm of prostate: Secondary | ICD-10-CM | POA: Diagnosis not present

## 2024-03-14 DIAGNOSIS — R9431 Abnormal electrocardiogram [ECG] [EKG]: Secondary | ICD-10-CM | POA: Diagnosis not present

## 2024-03-14 DIAGNOSIS — K219 Gastro-esophageal reflux disease without esophagitis: Secondary | ICD-10-CM | POA: Diagnosis not present

## 2024-03-14 DIAGNOSIS — R404 Transient alteration of awareness: Secondary | ICD-10-CM | POA: Diagnosis not present

## 2024-03-14 DIAGNOSIS — R42 Dizziness and giddiness: Secondary | ICD-10-CM | POA: Diagnosis not present

## 2024-03-14 DIAGNOSIS — F411 Generalized anxiety disorder: Secondary | ICD-10-CM | POA: Diagnosis not present

## 2024-03-14 DIAGNOSIS — E785 Hyperlipidemia, unspecified: Secondary | ICD-10-CM | POA: Diagnosis not present

## 2024-03-14 DIAGNOSIS — Z720 Tobacco use: Secondary | ICD-10-CM | POA: Diagnosis not present

## 2024-03-25 DIAGNOSIS — Z6822 Body mass index (BMI) 22.0-22.9, adult: Secondary | ICD-10-CM | POA: Diagnosis not present

## 2024-03-25 DIAGNOSIS — F17210 Nicotine dependence, cigarettes, uncomplicated: Secondary | ICD-10-CM | POA: Diagnosis not present

## 2024-03-25 DIAGNOSIS — Z1329 Encounter for screening for other suspected endocrine disorder: Secondary | ICD-10-CM | POA: Diagnosis not present

## 2024-03-25 DIAGNOSIS — R413 Other amnesia: Secondary | ICD-10-CM | POA: Diagnosis not present

## 2024-03-25 DIAGNOSIS — R4182 Altered mental status, unspecified: Secondary | ICD-10-CM | POA: Diagnosis not present

## 2024-03-27 DIAGNOSIS — F419 Anxiety disorder, unspecified: Secondary | ICD-10-CM | POA: Diagnosis not present

## 2024-03-27 DIAGNOSIS — I10 Essential (primary) hypertension: Secondary | ICD-10-CM | POA: Diagnosis not present

## 2024-03-27 DIAGNOSIS — I252 Old myocardial infarction: Secondary | ICD-10-CM | POA: Diagnosis not present

## 2024-03-27 DIAGNOSIS — R410 Disorientation, unspecified: Secondary | ICD-10-CM | POA: Diagnosis not present

## 2024-03-27 DIAGNOSIS — E785 Hyperlipidemia, unspecified: Secondary | ICD-10-CM | POA: Diagnosis not present

## 2024-03-27 DIAGNOSIS — Z720 Tobacco use: Secondary | ICD-10-CM | POA: Diagnosis not present

## 2024-03-27 DIAGNOSIS — R9431 Abnormal electrocardiogram [ECG] [EKG]: Secondary | ICD-10-CM | POA: Diagnosis not present

## 2024-03-27 DIAGNOSIS — Z6822 Body mass index (BMI) 22.0-22.9, adult: Secondary | ICD-10-CM | POA: Diagnosis not present

## 2024-03-27 DIAGNOSIS — R42 Dizziness and giddiness: Secondary | ICD-10-CM | POA: Diagnosis not present

## 2024-03-27 DIAGNOSIS — R682 Dry mouth, unspecified: Secondary | ICD-10-CM | POA: Diagnosis not present

## 2024-03-27 DIAGNOSIS — R4189 Other symptoms and signs involving cognitive functions and awareness: Secondary | ICD-10-CM | POA: Diagnosis not present

## 2024-03-27 DIAGNOSIS — F17210 Nicotine dependence, cigarettes, uncomplicated: Secondary | ICD-10-CM | POA: Diagnosis not present

## 2024-03-31 DIAGNOSIS — F419 Anxiety disorder, unspecified: Secondary | ICD-10-CM | POA: Diagnosis not present

## 2024-03-31 DIAGNOSIS — I10 Essential (primary) hypertension: Secondary | ICD-10-CM | POA: Diagnosis not present

## 2024-03-31 DIAGNOSIS — Z711 Person with feared health complaint in whom no diagnosis is made: Secondary | ICD-10-CM | POA: Diagnosis not present

## 2024-03-31 DIAGNOSIS — Z6821 Body mass index (BMI) 21.0-21.9, adult: Secondary | ICD-10-CM | POA: Diagnosis not present

## 2024-03-31 DIAGNOSIS — W2189XA Striking against or struck by other sports equipment, initial encounter: Secondary | ICD-10-CM | POA: Diagnosis not present

## 2024-03-31 DIAGNOSIS — Z720 Tobacco use: Secondary | ICD-10-CM | POA: Diagnosis not present

## 2024-03-31 DIAGNOSIS — R4182 Altered mental status, unspecified: Secondary | ICD-10-CM | POA: Diagnosis not present

## 2024-03-31 DIAGNOSIS — R42 Dizziness and giddiness: Secondary | ICD-10-CM | POA: Diagnosis not present

## 2024-03-31 DIAGNOSIS — R682 Dry mouth, unspecified: Secondary | ICD-10-CM | POA: Diagnosis not present

## 2024-03-31 DIAGNOSIS — R0602 Shortness of breath: Secondary | ICD-10-CM | POA: Diagnosis not present

## 2024-05-20 DIAGNOSIS — Z5321 Procedure and treatment not carried out due to patient leaving prior to being seen by health care provider: Secondary | ICD-10-CM | POA: Diagnosis not present

## 2024-07-03 DIAGNOSIS — R0600 Dyspnea, unspecified: Secondary | ICD-10-CM | POA: Diagnosis not present

## 2024-07-03 DIAGNOSIS — F17210 Nicotine dependence, cigarettes, uncomplicated: Secondary | ICD-10-CM | POA: Diagnosis not present

## 2024-07-03 DIAGNOSIS — R9082 White matter disease, unspecified: Secondary | ICD-10-CM | POA: Diagnosis not present

## 2024-07-03 DIAGNOSIS — K219 Gastro-esophageal reflux disease without esophagitis: Secondary | ICD-10-CM | POA: Diagnosis not present

## 2024-07-03 DIAGNOSIS — E785 Hyperlipidemia, unspecified: Secondary | ICD-10-CM | POA: Diagnosis not present

## 2024-07-03 DIAGNOSIS — R7989 Other specified abnormal findings of blood chemistry: Secondary | ICD-10-CM | POA: Diagnosis not present

## 2024-07-03 DIAGNOSIS — M1990 Unspecified osteoarthritis, unspecified site: Secondary | ICD-10-CM | POA: Diagnosis not present

## 2024-07-03 DIAGNOSIS — I10 Essential (primary) hypertension: Secondary | ICD-10-CM | POA: Diagnosis not present

## 2024-07-03 DIAGNOSIS — R0602 Shortness of breath: Secondary | ICD-10-CM | POA: Diagnosis not present

## 2024-07-03 DIAGNOSIS — R42 Dizziness and giddiness: Secondary | ICD-10-CM | POA: Diagnosis not present

## 2024-07-03 DIAGNOSIS — R55 Syncope and collapse: Secondary | ICD-10-CM | POA: Diagnosis not present

## 2024-07-16 ENCOUNTER — Ambulatory Visit: Admitting: Neurology

## 2024-07-16 ENCOUNTER — Encounter: Payer: Self-pay | Admitting: Neurology

## 2024-07-31 DIAGNOSIS — Z5321 Procedure and treatment not carried out due to patient leaving prior to being seen by health care provider: Secondary | ICD-10-CM | POA: Diagnosis not present

## 2024-07-31 DIAGNOSIS — Z7982 Long term (current) use of aspirin: Secondary | ICD-10-CM | POA: Diagnosis not present

## 2024-07-31 DIAGNOSIS — R42 Dizziness and giddiness: Secondary | ICD-10-CM | POA: Diagnosis not present

## 2024-07-31 DIAGNOSIS — R631 Polydipsia: Secondary | ICD-10-CM | POA: Diagnosis not present

## 2024-07-31 DIAGNOSIS — K219 Gastro-esophageal reflux disease without esophagitis: Secondary | ICD-10-CM | POA: Diagnosis not present

## 2024-07-31 DIAGNOSIS — I10 Essential (primary) hypertension: Secondary | ICD-10-CM | POA: Diagnosis not present

## 2024-07-31 DIAGNOSIS — Z720 Tobacco use: Secondary | ICD-10-CM | POA: Diagnosis not present

## 2024-07-31 DIAGNOSIS — Z79899 Other long term (current) drug therapy: Secondary | ICD-10-CM | POA: Diagnosis not present

## 2024-07-31 DIAGNOSIS — E785 Hyperlipidemia, unspecified: Secondary | ICD-10-CM | POA: Diagnosis not present

## 2024-07-31 DIAGNOSIS — F17210 Nicotine dependence, cigarettes, uncomplicated: Secondary | ICD-10-CM | POA: Diagnosis not present

## 2024-08-10 DIAGNOSIS — Z79891 Long term (current) use of opiate analgesic: Secondary | ICD-10-CM | POA: Diagnosis not present

## 2024-08-10 DIAGNOSIS — Z7982 Long term (current) use of aspirin: Secondary | ICD-10-CM | POA: Diagnosis not present

## 2024-08-10 DIAGNOSIS — R682 Dry mouth, unspecified: Secondary | ICD-10-CM | POA: Diagnosis not present

## 2024-08-10 DIAGNOSIS — Z79899 Other long term (current) drug therapy: Secondary | ICD-10-CM | POA: Diagnosis not present

## 2024-08-10 DIAGNOSIS — F17210 Nicotine dependence, cigarettes, uncomplicated: Secondary | ICD-10-CM | POA: Diagnosis not present
